# Patient Record
Sex: Female | Born: 1980 | Race: White | Hispanic: No | Marital: Married | Smoking: Never smoker
Health system: Southern US, Community
[De-identification: ages and names within clinical notes are randomized; demographics above are authoritative.]

## PROBLEM LIST (undated history)

## (undated) DIAGNOSIS — N200 Calculus of kidney: Secondary | ICD-10-CM

## (undated) DIAGNOSIS — R519 Headache, unspecified: Secondary | ICD-10-CM

## (undated) DIAGNOSIS — E559 Vitamin D deficiency, unspecified: Secondary | ICD-10-CM

## (undated) DIAGNOSIS — N939 Abnormal uterine and vaginal bleeding, unspecified: Secondary | ICD-10-CM

## (undated) DIAGNOSIS — N390 Urinary tract infection, site not specified: Secondary | ICD-10-CM

## (undated) DIAGNOSIS — D649 Anemia, unspecified: Secondary | ICD-10-CM

## (undated) DIAGNOSIS — E039 Hypothyroidism, unspecified: Secondary | ICD-10-CM

## (undated) DIAGNOSIS — E038 Other specified hypothyroidism: Secondary | ICD-10-CM

## (undated) DIAGNOSIS — R51 Headache: Secondary | ICD-10-CM

## (undated) DIAGNOSIS — E079 Disorder of thyroid, unspecified: Secondary | ICD-10-CM

## (undated) HISTORY — DX: Disorder of thyroid, unspecified: E07.9

## (undated) HISTORY — DX: Urinary tract infection, site not specified: N39.0

## (undated) HISTORY — DX: Other specified hypothyroidism: E03.8

## (undated) HISTORY — DX: Headache: R51

## (undated) HISTORY — DX: Anemia, unspecified: D64.9

## (undated) HISTORY — DX: Abnormal uterine and vaginal bleeding, unspecified: N93.9

## (undated) HISTORY — DX: Hypothyroidism, unspecified: E03.9

## (undated) HISTORY — DX: Headache, unspecified: R51.9

## (undated) HISTORY — DX: Calculus of kidney: N20.0

## (undated) HISTORY — PX: ABDOMINAL HYSTERECTOMY: SHX81

## (undated) HISTORY — DX: Vitamin D deficiency, unspecified: E55.9

---

## 2002-03-30 HISTORY — PX: FACIAL RECONSTRUCTION SURGERY: SHX631

## 2003-05-08 DIAGNOSIS — L905 Scar conditions and fibrosis of skin: Secondary | ICD-10-CM | POA: Insufficient documentation

## 2003-05-08 DIAGNOSIS — M950 Acquired deformity of nose: Secondary | ICD-10-CM | POA: Insufficient documentation

## 2003-05-08 DIAGNOSIS — J399 Disease of upper respiratory tract, unspecified: Secondary | ICD-10-CM | POA: Insufficient documentation

## 2006-12-06 ENCOUNTER — Encounter: Payer: Self-pay | Admitting: Maternal & Fetal Medicine

## 2007-03-31 ENCOUNTER — Observation Stay: Payer: Self-pay | Admitting: Obstetrics & Gynecology

## 2007-04-15 ENCOUNTER — Observation Stay: Payer: Self-pay

## 2007-05-31 ENCOUNTER — Observation Stay: Payer: Self-pay

## 2007-06-02 ENCOUNTER — Observation Stay: Payer: Self-pay | Admitting: Obstetrics & Gynecology

## 2007-06-03 ENCOUNTER — Observation Stay: Payer: Self-pay | Admitting: Emergency Medicine

## 2007-06-12 ENCOUNTER — Inpatient Hospital Stay: Payer: Self-pay

## 2007-06-20 ENCOUNTER — Observation Stay: Payer: Self-pay

## 2007-06-24 ENCOUNTER — Observation Stay: Payer: Self-pay | Admitting: Obstetrics & Gynecology

## 2007-06-27 ENCOUNTER — Observation Stay: Payer: Self-pay | Admitting: Unknown Physician Specialty

## 2007-06-28 ENCOUNTER — Observation Stay: Payer: Self-pay

## 2007-07-02 ENCOUNTER — Inpatient Hospital Stay: Payer: Self-pay | Admitting: Unknown Physician Specialty

## 2008-01-24 ENCOUNTER — Ambulatory Visit: Payer: Self-pay | Admitting: Obstetrics and Gynecology

## 2008-09-27 ENCOUNTER — Encounter: Payer: Self-pay | Admitting: Obstetrics and Gynecology

## 2008-12-04 ENCOUNTER — Encounter: Payer: Self-pay | Admitting: Pediatric Cardiology

## 2008-12-10 ENCOUNTER — Inpatient Hospital Stay: Payer: Self-pay | Admitting: Obstetrics and Gynecology

## 2009-01-03 ENCOUNTER — Observation Stay: Payer: Self-pay | Admitting: Unknown Physician Specialty

## 2009-01-16 ENCOUNTER — Observation Stay: Payer: Self-pay

## 2009-01-22 ENCOUNTER — Ambulatory Visit: Payer: Self-pay | Admitting: Urology

## 2009-01-24 ENCOUNTER — Observation Stay: Payer: Self-pay

## 2009-01-25 ENCOUNTER — Observation Stay: Payer: Self-pay | Admitting: Obstetrics & Gynecology

## 2009-01-25 ENCOUNTER — Ambulatory Visit: Payer: Self-pay | Admitting: Obstetrics & Gynecology

## 2009-01-28 ENCOUNTER — Observation Stay: Payer: Self-pay

## 2009-01-30 ENCOUNTER — Observation Stay: Payer: Self-pay | Admitting: Obstetrics & Gynecology

## 2009-02-02 ENCOUNTER — Observation Stay: Payer: Self-pay

## 2009-02-21 ENCOUNTER — Observation Stay: Payer: Self-pay

## 2009-03-08 ENCOUNTER — Observation Stay: Payer: Self-pay | Admitting: Obstetrics and Gynecology

## 2009-03-12 ENCOUNTER — Observation Stay: Payer: Self-pay | Admitting: Unknown Physician Specialty

## 2009-03-18 ENCOUNTER — Encounter: Payer: Self-pay | Admitting: Obstetrics and Gynecology

## 2009-03-18 ENCOUNTER — Observation Stay: Payer: Self-pay

## 2009-03-25 ENCOUNTER — Observation Stay: Payer: Self-pay

## 2009-03-27 ENCOUNTER — Observation Stay: Payer: Self-pay

## 2009-04-02 ENCOUNTER — Inpatient Hospital Stay: Payer: Self-pay

## 2009-06-06 ENCOUNTER — Ambulatory Visit: Payer: Self-pay | Admitting: Urology

## 2009-07-11 ENCOUNTER — Ambulatory Visit: Payer: Self-pay | Admitting: Family Medicine

## 2009-11-30 HISTORY — PX: LAPAROSCOPIC HYSTERECTOMY: SHX1926

## 2009-12-05 ENCOUNTER — Ambulatory Visit: Payer: Self-pay | Admitting: Family Medicine

## 2010-10-27 ENCOUNTER — Ambulatory Visit: Payer: Self-pay

## 2010-10-30 ENCOUNTER — Ambulatory Visit: Payer: Self-pay

## 2011-09-17 DIAGNOSIS — G43909 Migraine, unspecified, not intractable, without status migrainosus: Secondary | ICD-10-CM | POA: Insufficient documentation

## 2011-10-28 DIAGNOSIS — F329 Major depressive disorder, single episode, unspecified: Secondary | ICD-10-CM | POA: Insufficient documentation

## 2011-10-28 DIAGNOSIS — F330 Major depressive disorder, recurrent, mild: Secondary | ICD-10-CM | POA: Insufficient documentation

## 2012-09-19 DIAGNOSIS — Z90710 Acquired absence of both cervix and uterus: Secondary | ICD-10-CM | POA: Insufficient documentation

## 2013-08-17 LAB — TSH: TSH: 0.87 u[IU]/mL (ref 0.41–5.90)

## 2013-08-17 LAB — BASIC METABOLIC PANEL
BUN: 12 mg/dL (ref 4–21)
Creatinine: 0.6 mg/dL (ref 0.5–1.1)
GLUCOSE: 78 mg/dL
POTASSIUM: 4 mmol/L (ref 3.4–5.3)
SODIUM: 140 mmol/L (ref 137–147)

## 2013-08-17 LAB — CBC AND DIFFERENTIAL
HCT: 39 % (ref 36–46)
HCT: 39 % (ref 36–46)
HEMOGLOBIN: 13.2 g/dL (ref 12.0–16.0)
HEMOGLOBIN: 13.5 g/dL (ref 12.0–16.0)
Neutrophils Absolute: 4 /uL
PLATELETS: 197 10*3/uL (ref 150–399)
WBC: 6.2 10*3/mL
WBC: 6.2 10*3/mL

## 2014-03-29 ENCOUNTER — Emergency Department: Payer: Self-pay | Admitting: Emergency Medicine

## 2014-03-29 LAB — URINALYSIS, COMPLETE
BACTERIA: NONE SEEN
BLOOD: NEGATIVE
Bilirubin,UR: NEGATIVE
GLUCOSE, UR: NEGATIVE mg/dL (ref 0–75)
Ketone: NEGATIVE
LEUKOCYTE ESTERASE: NEGATIVE
Nitrite: NEGATIVE
PH: 7 (ref 4.5–8.0)
Protein: NEGATIVE
RBC,UR: NONE SEEN /HPF (ref 0–5)
SPECIFIC GRAVITY: 1.009 (ref 1.003–1.030)
Squamous Epithelial: 1
WBC UR: <1 /HPF (ref 0–5)

## 2014-03-29 LAB — COMPREHENSIVE METABOLIC PANEL
ALBUMIN: 3.9 g/dL (ref 3.4–5.0)
ALK PHOS: 126 U/L — AB
ALT: 23 U/L (ref 12–78)
Anion Gap: 6 — ABNORMAL LOW (ref 7–16)
BUN: 14 mg/dL (ref 7–18)
Bilirubin,Total: 0.4 mg/dL (ref 0.2–1.0)
CO2: 30 mmol/L (ref 21–32)
Calcium, Total: 9.2 mg/dL (ref 8.5–10.1)
Chloride: 104 mmol/L (ref 98–107)
Creatinine: 0.54 mg/dL — ABNORMAL LOW (ref 0.60–1.30)
EGFR (African American): >60
EGFR (Non-African Amer.): >60
Glucose: 84 mg/dL (ref 65–99)
Osmolality: 279 (ref 275–301)
Potassium: 3.6 mmol/L (ref 3.5–5.1)
SGOT(AST): 14 U/L — ABNORMAL LOW (ref 15–37)
Sodium: 140 mmol/L (ref 136–145)
Total Protein: 7.7 g/dL (ref 6.4–8.2)

## 2014-03-29 LAB — CBC
HCT: 43.2 % (ref 35.0–47.0)
HGB: 14.1 g/dL (ref 12.0–16.0)
MCH: 27.9 pg (ref 26.0–34.0)
MCHC: 32.6 g/dL (ref 32.0–36.0)
MCV: 85 fL (ref 80–100)
Platelet: 216 10*3/uL (ref 150–440)
RBC: 5.05 10*6/uL (ref 3.80–5.20)
RDW: 13.3 % (ref 11.5–14.5)
WBC: 6.4 10*3/uL (ref 3.6–11.0)

## 2014-03-29 LAB — LIPASE, BLOOD: Lipase: 225 U/L (ref 73–393)

## 2014-04-02 LAB — HEPATIC FUNCTION PANEL
ALK PHOS: 102 U/L (ref 25–125)
ALT: 27 U/L (ref 7–35)
AST: 21 U/L (ref 13–35)

## 2014-04-02 LAB — BASIC METABOLIC PANEL
BUN: 15 mg/dL (ref 4–21)
CREATININE: 0.8 mg/dL (ref 0.5–1.1)
GLUCOSE: 91 mg/dL
POTASSIUM: 3.8 mmol/L (ref 3.4–5.3)
SODIUM: 140 mmol/L (ref 137–147)

## 2014-04-02 LAB — TSH: TSH: 3.12 u[IU]/mL (ref 0.41–5.90)

## 2014-07-04 ENCOUNTER — Encounter: Payer: Self-pay | Admitting: *Deleted

## 2014-07-19 ENCOUNTER — Ambulatory Visit: Payer: Self-pay | Admitting: General Surgery

## 2014-07-31 ENCOUNTER — Ambulatory Visit (INDEPENDENT_AMBULATORY_CARE_PROVIDER_SITE_OTHER): Payer: BC Managed Care – PPO | Admitting: General Surgery

## 2014-07-31 ENCOUNTER — Encounter: Payer: Self-pay | Admitting: General Surgery

## 2014-07-31 VITALS — BP 114/72 | HR 76 | Resp 12 | Ht 62.0 in | Wt 189.0 lb

## 2014-07-31 DIAGNOSIS — K644 Residual hemorrhoidal skin tags: Secondary | ICD-10-CM

## 2014-07-31 NOTE — Progress Notes (Signed)
Patient ID: Bethany Mitchell, female   DOB: [birth year], 33 y.o.   MRN: [id number]  Chief Complaint  Patient presents with  . Mass    near rectum    HPI Bethany Mitchell is a 33 y.o. female.  Here today to evaluate a small lesion near her rectum. States it has been there for about 5 years. Does not feel it has changed in size. Seems to be like a "skin tag". For the past 6 months it seems to be bother her more especially with wiping after going to the restroom. It is not draining. It seems to irritating her more. Daily bowels movements are normal.  The patient is accompanied today by her husband, [name].  HPI  Past Medical History  Diagnosis Date  . Thyroid disease   . Anemia     blood transfusion 2003    Past Surgical History  Procedure Laterality Date  . Laparoscopic hysterectomy  2011  . Facial reconstruction surgery  May 2003    Family History  Problem Relation Age of Onset  . Diabetes Father   . Thyroid disease Mother     Social History History  Substance Use Topics  . Smoking status: Never Smoker   . Smokeless tobacco: Never Used  . Alcohol Use: Yes     Comment: occasionally    No Known Allergies  Current Outpatient Prescriptions  Medication Sig Dispense Refill  . levothyroxine (SYNTHROID, LEVOTHROID) 50 MCG tablet Take 50 mcg by mouth daily before breakfast.       No current facility-administered medications for this visit.    Review of Systems Review of Systems  Constitutional: Negative.   Respiratory: Negative.   Cardiovascular: Negative.     Blood pressure 114/72, pulse 76, resp. rate 12, height 5\' 2"  (1.575 m), weight 189 lb (85.73 kg).  Physical Exam Physical Exam  Constitutional: She is oriented to person, place, and time. She appears well-developed and well-nourished.  Neck: Neck supple.  Cardiovascular: Normal rate, regular rhythm and normal heart sounds.   Pulmonary/Chest: Effort normal and breath sounds normal.  Abdominal: Soft. Normal  appearance.  Genitourinary:     Tiny anal skin tag  Lymphadenopathy:    She has no cervical adenopathy.  Neurological: She is alert and oriented to person, place, and time.  Skin: Skin is warm and dry.       Assessment    Perianal inflammation secondary to vigorous perineural cleansing. Small skin tag.     Plan    The patient has been encouraged to discontinue the use of soap on the perineum.  If she does not show adequate improvement over the next month she was encouraged to call for reassessment.  No soap to rectal area, use wet wipes. Use preparation H after BM and at bedtime.    PCP [facility]  Primary Care Ref. Dr. Wynona Luna, [place] 08/01/2014, 8:28 PM

## 2014-07-31 NOTE — Patient Instructions (Addendum)
The patient is aware to call back for any questions or concerns. No soap to rectal area, use wet wipes. Use preparation H after BM and at bedtime.

## 2014-08-01 DIAGNOSIS — K644 Residual hemorrhoidal skin tags: Secondary | ICD-10-CM | POA: Insufficient documentation

## 2014-10-01 ENCOUNTER — Encounter: Payer: Self-pay | Admitting: General Surgery

## 2014-11-05 LAB — TSH: TSH: 6.16 u[IU]/mL — AB (ref 0.41–5.90)

## 2015-02-18 ENCOUNTER — Encounter: Payer: Self-pay | Admitting: Nurse Practitioner

## 2015-02-18 ENCOUNTER — Encounter (INDEPENDENT_AMBULATORY_CARE_PROVIDER_SITE_OTHER): Payer: Self-pay

## 2015-02-18 ENCOUNTER — Ambulatory Visit (INDEPENDENT_AMBULATORY_CARE_PROVIDER_SITE_OTHER): Payer: BC Managed Care – PPO | Admitting: Nurse Practitioner

## 2015-02-18 VITALS — BP 110/78 | HR 61 | Temp 98.5°F | Resp 12 | Ht 61.75 in | Wt 198.8 lb

## 2015-02-18 DIAGNOSIS — E038 Other specified hypothyroidism: Secondary | ICD-10-CM | POA: Insufficient documentation

## 2015-02-18 DIAGNOSIS — Z862 Personal history of diseases of the blood and blood-forming organs and certain disorders involving the immune mechanism: Secondary | ICD-10-CM

## 2015-02-18 DIAGNOSIS — Z7189 Other specified counseling: Secondary | ICD-10-CM

## 2015-02-18 DIAGNOSIS — E039 Hypothyroidism, unspecified: Secondary | ICD-10-CM

## 2015-02-18 DIAGNOSIS — Z7689 Persons encountering health services in other specified circumstances: Secondary | ICD-10-CM | POA: Insufficient documentation

## 2015-02-18 MED ORDER — PHENTERMINE HCL 37.5 MG PO TABS: 37.5000 mg | ORAL_TABLET | Freq: Every day | ORAL | Status: DC

## 2015-02-18 NOTE — Progress Notes (Signed)
Subjective:    Patient ID: Bethany Mitchell, female    DOB: [birth year], 34 y.o.   MRN: [id number]  HPI  Bethany Mitchell is a 34 yo female establishing care today.   1) New Pt. Info-    Diet- Eats out and at home   Exercise- chases around young children   Immunizations- UTD  Pap- Nov. 2015 at [facility]- Not UTD  2) Chronic Problems-  Hypothyroidism- No recent thyroid levels.   Kidney Stones- with her last pregnancy   Anemia- in past  Frequent headaches- Improved   3) Acute Problems-  Weight gain quickly up 9 month period  Herbalife- gained after stopping   Weight Watchers- modified version   Carb cutting programs- not helpful   Review of Systems  Constitutional: Negative for fever, chills, diaphoresis and fatigue.  Eyes: Negative for visual disturbance.  Respiratory: Negative for chest tightness, shortness of breath and wheezing.   Cardiovascular: Negative for chest pain, palpitations and leg swelling.  Gastrointestinal: Negative for nausea, vomiting, diarrhea and constipation.  Genitourinary: Negative for dysuria.  Musculoskeletal: Negative for back pain and neck pain.  Skin: Negative for rash.  Neurological: Negative for dizziness, weakness, numbness and headaches.  Hematological: Bruises/bleeds easily.       Bruises easily- chronic  Psychiatric/Behavioral: Negative for suicidal ideas and sleep disturbance. The patient is not nervous/anxious.    Past Medical History  Diagnosis Date  . Thyroid disease   . Anemia     blood transfusion 2003  . Frequent headaches   . Kidney stone     History   Social History  . Marital Status: Married    Spouse Name: N/A  . Number of Children: N/A  . Years of Education: N/A   Occupational History  . Not on file.   Social History Main Topics  . Smoking status: Never Smoker   . Smokeless tobacco: Never Used  . Alcohol Use: 0.0 oz/week    0 Standard drinks or equivalent per week     Comment:  occasionally  . Drug Use: No  . Sexual Activity: Not Currently   Other Topics Concern  . Not on file   Social History Narrative   Works for school system   Lives at home with husband and 2 girls   Pets 2 dogs and 2 fish    Left handed   Some college    Enjoys being with the family     Past Surgical History  Procedure Laterality Date  . Laparoscopic hysterectomy  2011  . Facial reconstruction surgery  May 2003  . Abdominal hysterectomy      Partial    Family History  Problem Relation Age of Onset  . Diabetes Father   . Thyroid disease Mother   . Depression Mother   . Anxiety disorder Mother   . Cancer Maternal Grandmother     breast cancer  . Thyroid disease Maternal Grandmother   . Diabetes Maternal Grandfather     No Known Allergies  Current Outpatient Prescriptions on File Prior to Visit  Medication Sig Dispense Refill  . levothyroxine (SYNTHROID, LEVOTHROID) 50 MCG tablet Take 50 mcg by mouth daily before breakfast.     No current facility-administered medications on file prior to visit.      Objective:   Physical Exam  Constitutional: She is oriented to person, place, and time. She appears well-developed and well-nourished. No distress.  BP 110/78 mmHg  Pulse 61  Temp(Src) 98.5 F (36.9 C) (Oral)  Resp 12  Ht 5' 1.75" (1.568 m)  Wt 198 lb 12 oz (90.152 kg)  BMI 36.67 kg/m2  SpO2 97%   HENT:  Head: Normocephalic and atraumatic.  Right Ear: External ear normal.  Left Ear: External ear normal.  Cardiovascular: Normal rate, regular rhythm and normal heart sounds.  Exam reveals no gallop and no friction rub.   No murmur heard. Pulmonary/Chest: Effort normal and breath sounds normal. No respiratory distress. She has no wheezes. She has no rales. She exhibits no tenderness.  Neurological: She is alert and oriented to person, place, and time. No cranial nerve deficit. She exhibits normal muscle tone. Coordination normal.  Skin: Skin is warm and dry. No  rash noted. She is not diaphoretic.  Psychiatric: She has a normal mood and affect. Her behavior is normal. Judgment and thought content normal.      Assessment & Plan:

## 2015-02-18 NOTE — Assessment & Plan Note (Signed)
Checking TSH and T4 before refilling the 50 mcg tablet script.

## 2015-02-18 NOTE — Progress Notes (Signed)
Pre visit review using our clinic review tool, if applicable. No additional management support is needed unless otherwise documented below in the visit note. 

## 2015-02-18 NOTE — Assessment & Plan Note (Signed)
Discussed acute and chronic issues. Reviewed health maintenance measures, PFSHx, and immunizations. Obtain routine labs TSH, Lipid panel, CBC w/ diff, A1c, and CMET.

## 2015-02-18 NOTE — Patient Instructions (Addendum)
I have written a prescription for phentermine for 1 month. You can pick it up at the office during business hours.  Please have your vital signs checked a week after starting and let me know the results.  I will need those measurements, or you can return for an RN visit, before I can refill the phentermine for additional months.  If pulse is > 100 or BP is > 140/80 we should repeat your evaluation after being off  of phentermine for a few days.  If both are below those parameters,  You can continue the medication and return to see me in 3 months.  Most people take 1/2 tablet in the morning,  The second half by 2 PM to avoid insomnia.  If you have not lost 9.9 lbs (which is 5% of your starting weight)  by the end of the  3 months, the risks of continuing the medication outweigh the benefits and  we will have to discontinue it and find a Plan B .   Welcome to [facility]!

## 2015-02-19 LAB — CBC WITH DIFFERENTIAL/PLATELET
Basophils Absolute: 0 10*3/uL (ref 0.0–0.1)
Basophils Relative: 0.5 % (ref 0.0–3.0)
Eosinophils Absolute: 0.1 10*3/uL (ref 0.0–0.7)
Eosinophils Relative: 0.8 % (ref 0.0–5.0)
HEMATOCRIT: 39.2 % (ref 36.0–46.0)
Hemoglobin: 13.5 g/dL (ref 12.0–15.0)
Lymphocytes Relative: 29.6 % (ref 12.0–46.0)
Lymphs Abs: 2.2 10*3/uL (ref 0.7–4.0)
MCHC: 34.5 g/dL (ref 30.0–36.0)
MCV: 81.4 fl (ref 78.0–100.0)
MONOS PCT: 5.3 % (ref 3.0–12.0)
Monocytes Absolute: 0.4 10*3/uL (ref 0.1–1.0)
NEUTROS ABS: 4.8 10*3/uL (ref 1.4–7.7)
Neutrophils Relative %: 63.8 % (ref 43.0–77.0)
Platelets: 231 10*3/uL (ref 150.0–400.0)
RBC: 4.82 Mil/uL (ref 3.87–5.11)
RDW: 13.2 % (ref 11.5–15.5)
WBC: 7.6 10*3/uL (ref 4.0–10.5)

## 2015-02-19 LAB — TSH: TSH: 6.08 u[IU]/mL — AB (ref 0.35–4.50)

## 2015-02-19 LAB — T4, FREE: Free T4: 0.7 ng/dL (ref 0.60–1.60)

## 2015-02-21 ENCOUNTER — Encounter: Payer: Self-pay | Admitting: *Deleted

## 2015-02-25 ENCOUNTER — Telehealth: Payer: Self-pay | Admitting: Nurse Practitioner

## 2015-02-25 MED ORDER — PHENTERMINE HCL 37.5 MG PO TABS: 37.5000 mg | ORAL_TABLET | Freq: Every day | ORAL | Status: DC

## 2015-02-25 NOTE — Telephone Encounter (Signed)
Okay to refill #30 and 1 refill.

## 2015-02-25 NOTE — Telephone Encounter (Signed)
Please advise if okay to refill phentermine Rx. Review BP below Thank you

## 2015-02-25 NOTE — Telephone Encounter (Signed)
Rx faxed to [facility] pharmacy in [place] as requested by patient.

## 2015-02-25 NOTE — Telephone Encounter (Signed)
The patient is requesting a refill on her phentermine . She was asked to call back to inform Morey Hummingbird of her BP reading that reading is 110/74.

## 2015-02-25 NOTE — Telephone Encounter (Signed)
Rx printed and given to carrie for signature.

## 2015-03-14 ENCOUNTER — Encounter: Payer: Self-pay | Admitting: Nurse Practitioner

## 2015-09-09 ENCOUNTER — Ambulatory Visit (INDEPENDENT_AMBULATORY_CARE_PROVIDER_SITE_OTHER): Payer: BC Managed Care – PPO | Admitting: Nurse Practitioner

## 2015-09-09 VITALS — BP 110/80 | HR 60 | Temp 98.5°F | Resp 14 | Ht 62.0 in | Wt 184.6 lb

## 2015-09-09 DIAGNOSIS — E039 Hypothyroidism, unspecified: Secondary | ICD-10-CM

## 2015-09-09 DIAGNOSIS — E669 Obesity, unspecified: Secondary | ICD-10-CM

## 2015-09-09 DIAGNOSIS — J3489 Other specified disorders of nose and nasal sinuses: Secondary | ICD-10-CM

## 2015-09-09 MED ORDER — FLUTICASONE PROPIONATE 50 MCG/ACT NA SUSP
2.0000 | Freq: Every day | NASAL | Status: DC
Start: 2015-09-09 — End: 2016-03-26

## 2015-09-09 NOTE — Progress Notes (Signed)
Patient ID: Bethany Mitchell, female    DOB: 04/10/1981  Age: 34 y.o. MRN: [id number]  CC: Follow-up   HPI Bethany Mitchell presents for follow up of weight loss and thyroid.  1) Even with taking a half a tablet still not sleeping weel.  Diet- Small portions, down to 1 soda every 2 days, daily 1 cup of coffee  Exercise- Swimming, walking, and uses resistance bands Phentermine- been on it since March and down 14 lbs   Took a break occasionally, last dosage 3 weeks  Goal 155 lbs   Wt Readings from Last 3 Encounters:  09/09/15 184 lb 9.6 oz (83.734 kg)  02/18/15 198 lb 12 oz (90.152 kg)  07/31/14 189 lb (85.73 kg)   2) Sinus pressure  Sudafed sinus/headache  Zyrtec   History Bethany Mitchell has a past medical history of Thyroid disease; Anemia; Frequent headaches; and Kidney stone.   She has past surgical history that includes Laparoscopic hysterectomy (2011); Facial reconstruction surgery (May 2003); and Abdominal hysterectomy.   Her family history includes Anxiety disorder in her mother; Cancer in her maternal grandmother; Depression in her mother; Diabetes in her father and maternal grandfather; Thyroid disease in her maternal grandmother and mother.She reports that she has never smoked. She has never used smokeless tobacco. She reports that she drinks alcohol. She reports that she does not use illicit drugs.  Outpatient Prescriptions Prior to Visit  Medication Sig Dispense Refill  . cetirizine (ZYRTEC) 10 MG tablet Take 10 mg by mouth daily.    Marland Kitchen levothyroxine (SYNTHROID, LEVOTHROID) 50 MCG tablet Take 50 mcg by mouth daily before breakfast.    . phentermine (ADIPEX-P) 37.5 MG tablet Take 1 tablet (37.5 mg total) by mouth daily before breakfast. 30 tablet 1   No facility-administered medications prior to visit.   ROS Review of Systems  Constitutional: Negative for fever, chills, diaphoresis and fatigue.  HENT: Positive for sinus pressure.   Respiratory: Negative for chest tightness,  shortness of breath and wheezing.   Cardiovascular: Negative for chest pain, palpitations and leg swelling.  Gastrointestinal: Negative for nausea, vomiting and diarrhea.  Skin: Negative for rash.  Neurological: Negative for dizziness, weakness, numbness and headaches.  Psychiatric/Behavioral: Positive for sleep disturbance. The patient is not nervous/anxious.    Objective:  BP 110/80 mmHg  Pulse 60  Temp(Src) 98.5 F (36.9 C)  Resp 14  Ht 5\' 2"  (1.575 m)  Wt 184 lb 9.6 oz (83.734 kg)  BMI 33.76 kg/m2  SpO2 99%  Physical Exam  Constitutional: She is oriented to person, place, and time. She appears well-developed and well-nourished. No distress.  HENT:  Head: Normocephalic and atraumatic.  Right Ear: External ear normal.  Left Ear: External ear normal.  TMs and canals clear bilaterally  Eyes: EOM are normal. Pupils are equal, round, and reactive to light. Right eye exhibits no discharge. Left eye exhibits no discharge. No scleral icterus.  Neck: Normal range of motion. Neck supple. No thyromegaly present.  Cardiovascular: Normal rate, regular rhythm and normal heart sounds.   Pulmonary/Chest: Effort normal and breath sounds normal. No respiratory distress. She has no wheezes. She has no rales. She exhibits no tenderness.  Lymphadenopathy:    She has no cervical adenopathy.  Neurological: She is alert and oriented to person, place, and time. No cranial nerve deficit. She exhibits normal muscle tone. Coordination normal.  Skin: Skin is warm and dry. No rash noted. She is not diaphoretic.  Psychiatric: She has a normal mood and affect.  Her behavior is normal. Judgment and thought content normal.   Assessment & Plan:   Bethany Mitchell was seen today for follow-up.  Diagnoses and all orders for this visit:  Hypothyroidism, unspecified hypothyroidism type -     TSH -     T4, free  Other orders -     fluticasone (FLONASE) 50 MCG/ACT nasal spray; Place 2 sprays into both nostrils  daily.   I have discontinued Bethany Mitchell's phentermine. I am also having her start on fluticasone. Additionally, I am having her maintain her levothyroxine and cetirizine.  Meds ordered this encounter  Medications  . fluticasone (FLONASE) 50 MCG/ACT nasal spray    Sig: Place 2 sprays into both nostrils daily.    Dispense:  16 g    Refill:  6    Order Specific Question:  Supervising Provider    Answer:  Crecencio Mc [[id number]]     Follow-up: No Follow-up on file.

## 2015-09-09 NOTE — Patient Instructions (Signed)
Please visit the lab before leaving today.   Flonase nasal spray for sinus pressure.

## 2015-09-09 NOTE — Progress Notes (Signed)
Pre visit review using our clinic review tool, if applicable. No additional management support is needed unless otherwise documented below in the visit note. 

## 2015-09-10 LAB — TSH: TSH: 4.72 u[IU]/mL — ABNORMAL HIGH (ref 0.35–4.50)

## 2015-09-10 LAB — T4, FREE: Free T4: 0.73 ng/dL (ref 0.60–1.60)

## 2015-09-11 ENCOUNTER — Encounter: Payer: Self-pay | Admitting: Nurse Practitioner

## 2015-09-11 DIAGNOSIS — E669 Obesity, unspecified: Secondary | ICD-10-CM | POA: Insufficient documentation

## 2015-09-11 DIAGNOSIS — E66812 Obesity, class 2: Secondary | ICD-10-CM | POA: Insufficient documentation

## 2015-09-11 DIAGNOSIS — J3489 Other specified disorders of nose and nasal sinuses: Secondary | ICD-10-CM | POA: Insufficient documentation

## 2015-09-11 NOTE — Assessment & Plan Note (Signed)
We'll try Flonase for sinus pressure. Gave patient information regarding symptoms of sinus infection and when to return to care for antibiotics.

## 2015-09-11 NOTE — Assessment & Plan Note (Signed)
Patient is doing well on half a tablet of Adipex-P. She would like to stop it at this time and feel she can keep going with diet and exercise.  Wt Readings from Last 3 Encounters:  09/09/15 184 lb 9.6 oz (83.734 kg)  02/18/15 198 lb 12 oz (90.152 kg)  07/31/14 189 lb (85.73 kg)

## 2015-09-11 NOTE — Assessment & Plan Note (Signed)
Recheck thyroid panel today 

## 2016-03-26 ENCOUNTER — Encounter: Payer: Self-pay | Admitting: Nurse Practitioner

## 2016-03-26 ENCOUNTER — Ambulatory Visit (INDEPENDENT_AMBULATORY_CARE_PROVIDER_SITE_OTHER): Payer: BC Managed Care – PPO | Admitting: Nurse Practitioner

## 2016-03-26 VITALS — BP 104/72 | HR 68 | Temp 98.2°F | Resp 12 | Ht 62.0 in | Wt 198.0 lb

## 2016-03-26 DIAGNOSIS — E669 Obesity, unspecified: Secondary | ICD-10-CM

## 2016-03-26 DIAGNOSIS — I889 Nonspecific lymphadenitis, unspecified: Secondary | ICD-10-CM | POA: Diagnosis not present

## 2016-03-26 MED ORDER — DOXYCYCLINE HYCLATE 100 MG PO TABS: 100.0000 mg | ORAL_TABLET | Freq: Two times a day (BID) | ORAL | Status: DC

## 2016-03-26 MED ORDER — PHENTERMINE HCL 37.5 MG PO TABS: 37.5000 mg | ORAL_TABLET | Freq: Every day | ORAL | Status: DC

## 2016-03-26 NOTE — Progress Notes (Signed)
Patient ID: Bethany Mitchell, female    DOB: 1981-09-28  Age: 35 y.o. MRN: [id number]  CC: Mass and Weight Loss   HPI Bethany Mitchell presents for right axilla concern and wt loss medication.   1) Pt reports deep tenderness in right axilla at 12-1 o'clock position- pt has to show me because there is no visual differences. She reports feeling tenderness while shaving and putting on Deoderant then she felt this knot deep in the axilla. She denies growing, swelling, drainage, other signs of infection nor breast concerns of right breast.   2) Pt interested in re-starting phentermine due to Wt gain. She now understands that she has to continue the healthy diet and exercise along with the medication AND once off of the medication to maintain. Denies having jitteriness, insomnia, anxiety, nor palpitations. She reports she would like to do the half tablet again  History Bethany Mitchell has a past medical history of Thyroid disease; Anemia; Frequent headaches; and Kidney stone.   She has past surgical history that includes Laparoscopic hysterectomy (2011); Facial reconstruction surgery (May 2003); and Abdominal hysterectomy.   Her family history includes Anxiety disorder in her mother; Cancer in her maternal grandmother; Depression in her mother; Diabetes in her father and maternal grandfather; Thyroid disease in her maternal grandmother and mother.She reports that she has never smoked. She has never used smokeless tobacco. She reports that she drinks alcohol. She reports that she does not use illicit drugs.  Outpatient Prescriptions Prior to Visit  Medication Sig Dispense Refill  . cetirizine (ZYRTEC) 10 MG tablet Take 10 mg by mouth daily. Reported on 03/26/2016    . levothyroxine (SYNTHROID, LEVOTHROID) 50 MCG tablet Take 50 mcg by mouth daily before breakfast. Reported on 03/26/2016    . fluticasone (FLONASE) 50 MCG/ACT nasal spray Place 2 sprays into both nostrils daily. (Patient not taking: Reported on  03/26/2016) 16 g 6   No facility-administered medications prior to visit.    ROS Review of Systems  Constitutional: Negative for fever, chills, diaphoresis and fatigue.  Respiratory: Negative for chest tightness, shortness of breath and wheezing.   Cardiovascular: Negative for chest pain, palpitations and leg swelling.  Gastrointestinal: Negative for nausea, vomiting and diarrhea.  Skin: Negative for color change, pallor, rash and wound.  Neurological: Negative for dizziness, weakness, numbness and headaches.  Psychiatric/Behavioral: The patient is not nervous/anxious.     Objective:  BP 104/72 mmHg  Pulse 68  Temp(Src) 98.2 F (36.8 C) (Oral)  Resp 12  Ht 5\' 2"  (1.575 m)  Wt 198 lb (89.812 kg)  BMI 36.21 kg/m2  SpO2 98%  Physical Exam  Constitutional: She is oriented to person, place, and time. She appears well-developed and well-nourished. No distress.  HENT:  Head: Normocephalic and atraumatic.  Right Ear: External ear normal.  Left Ear: External ear normal.  Cardiovascular: Normal rate, regular rhythm, normal heart sounds and intact distal pulses.  Exam reveals no gallop and no friction rub.   No murmur heard. Pulmonary/Chest: Effort normal and breath sounds normal. No respiratory distress. She has no wheezes. She has no rales. She exhibits no tenderness.  Lymphadenopathy:    She has axillary adenopathy.       Right axillary: Lateral adenopathy present. No pectoral adenopathy present.       Left axillary: No pectoral and no lateral adenopathy present. No cyst or visual abnormality visualize- 1 cm or greater mass that is tender and rubbery felt to palpation, left axilla without findings  Neurological: She  is alert and oriented to person, place, and time.  Skin: Skin is warm and dry. No rash noted. She is not diaphoretic.  Psychiatric: She has a normal mood and affect. Her behavior is normal. Judgment and thought content normal.   Assessment & Plan:   Bethany Mitchell was seen  today for mass and weight loss.  Diagnoses and all orders for this visit:  Axillary lymphadenitis  Obese  Other orders -     phentermine (ADIPEX-P) 37.5 MG tablet; Take 1 tablet (37.5 mg total) by mouth daily before breakfast. -     doxycycline (VIBRA-TABS) 100 MG tablet; Take 1 tablet (100 mg total) by mouth 2 (two) times daily.  I have discontinued Bethany Mitchell's fluticasone. I am also having her start on phentermine and doxycycline. Additionally, I am having her maintain her levothyroxine and cetirizine.  Meds ordered this encounter  Medications  . phentermine (ADIPEX-P) 37.5 MG tablet    Sig: Take 1 tablet (37.5 mg total) by mouth daily before breakfast.    Dispense:  30 tablet    Refill:  2    Order Specific Question:  Supervising Provider    Answer:  Deborra Medina L [[id number]]  . doxycycline (VIBRA-TABS) 100 MG tablet    Sig: Take 1 tablet (100 mg total) by mouth 2 (two) times daily.    Dispense:  14 tablet    Refill:  0    Order Specific Question:  Supervising Provider    Answer:  Crecencio Mc [[id number]]     Follow-up: Return if symptoms worsen or fail to improve.

## 2016-03-26 NOTE — Patient Instructions (Addendum)
Try the doxycyline as prescribed.   Let me know if you don't get improvement and we will get the ultrasound.

## 2016-03-31 DIAGNOSIS — I889 Nonspecific lymphadenitis, unspecified: Secondary | ICD-10-CM | POA: Insufficient documentation

## 2016-03-31 NOTE — Assessment & Plan Note (Signed)
Pt is wishing to start back on Adipex- has been over three months off of medication Pt will take 1/2 tablet prn  3 months of medication given to pt to take to pharmacy  Follow up if feeling any jitteriness, insomnia, or increased anxiety/palpitations.

## 2016-03-31 NOTE — Assessment & Plan Note (Signed)
Deep, not superficial findings believed to be more lymphadenitis than abscess. Treating with doxycyline at fist- discussed further work ups including [place] and mammogram due to breast CA history in family. Pt wants to see if the doxy clears this up first then will let me know if not.

## 2016-04-08 ENCOUNTER — Other Ambulatory Visit: Payer: Self-pay | Admitting: Nurse Practitioner

## 2016-04-08 ENCOUNTER — Telehealth: Payer: Self-pay | Admitting: *Deleted

## 2016-04-08 NOTE — Telephone Encounter (Signed)
Would you like patient to return for evaluation?

## 2016-04-08 NOTE — Telephone Encounter (Signed)
Patient stated that she was advise to call the office in ten days if the knot under her arm did not dissolve. She finished her antibiotic on Friday, with no results,she stated that the spot was still tender and sore. Please advise if she will need a follow up appt.

## 2016-04-09 ENCOUNTER — Other Ambulatory Visit: Payer: Self-pay | Admitting: Nurse Practitioner

## 2016-04-09 DIAGNOSIS — R590 Localized enlarged lymph nodes: Secondary | ICD-10-CM

## 2016-04-09 DIAGNOSIS — I889 Nonspecific lymphadenitis, unspecified: Secondary | ICD-10-CM

## 2016-04-09 NOTE — Telephone Encounter (Signed)
I have ordered a [place] to evaluate. Please let pt know they will call to set up. Thanks!

## 2016-04-09 NOTE — Telephone Encounter (Signed)
Left her a VM with details that a [place] will be set up. thanks

## 2016-04-23 ENCOUNTER — Ambulatory Visit
Admission: RE | Admit: 2016-04-23 | Discharge: 2016-04-23 | Disposition: A | Discharge status: Home / Self Care | Payer: BC Managed Care – PPO | Source: Ambulatory Visit | Attending: Nurse Practitioner | Admitting: Nurse Practitioner

## 2016-04-23 ENCOUNTER — Other Ambulatory Visit: Payer: Self-pay | Admitting: Nurse Practitioner

## 2016-04-23 DIAGNOSIS — R590 Localized enlarged lymph nodes: Secondary | ICD-10-CM

## 2016-04-23 DIAGNOSIS — I889 Nonspecific lymphadenitis, unspecified: Secondary | ICD-10-CM

## 2016-04-23 DIAGNOSIS — N644 Mastodynia: Secondary | ICD-10-CM | POA: Insufficient documentation

## 2016-04-29 ENCOUNTER — Other Ambulatory Visit: Payer: Self-pay | Admitting: Family Medicine

## 2016-04-29 DIAGNOSIS — N63 Unspecified lump in unspecified breast: Secondary | ICD-10-CM

## 2016-05-08 ENCOUNTER — Ambulatory Visit
Admission: RE | Admit: 2016-05-08 | Discharge: 2016-05-08 | Disposition: A | Discharge status: Home / Self Care | Payer: BC Managed Care – PPO | Source: Ambulatory Visit | Attending: Family Medicine | Admitting: Family Medicine

## 2016-05-08 ENCOUNTER — Ambulatory Visit: Payer: BC Managed Care – PPO

## 2016-05-08 DIAGNOSIS — N63 Unspecified lump in unspecified breast: Secondary | ICD-10-CM

## 2016-05-08 DIAGNOSIS — D242 Benign neoplasm of left breast: Secondary | ICD-10-CM | POA: Insufficient documentation

## 2016-05-11 LAB — SURGICAL PATHOLOGY

## 2016-10-06 ENCOUNTER — Emergency Department
Admission: EM | Admit: 2016-10-06 | Discharge: 2016-10-06 | Disposition: A | Discharge status: Home / Self Care | Payer: BC Managed Care – PPO | Attending: Emergency Medicine | Admitting: Emergency Medicine

## 2016-10-06 ENCOUNTER — Encounter: Payer: Self-pay | Admitting: Emergency Medicine

## 2016-10-06 DIAGNOSIS — Z79899 Other long term (current) drug therapy: Secondary | ICD-10-CM | POA: Insufficient documentation

## 2016-10-06 DIAGNOSIS — J209 Acute bronchitis, unspecified: Secondary | ICD-10-CM | POA: Diagnosis not present

## 2016-10-06 DIAGNOSIS — Z792 Long term (current) use of antibiotics: Secondary | ICD-10-CM | POA: Insufficient documentation

## 2016-10-06 DIAGNOSIS — R05 Cough: Secondary | ICD-10-CM | POA: Diagnosis present

## 2016-10-06 DIAGNOSIS — J069 Acute upper respiratory infection, unspecified: Secondary | ICD-10-CM

## 2016-10-06 MED ORDER — MAGIC MOUTHWASH W/LIDOCAINE: 5.0000 mL | Freq: Four times a day (QID) | ORAL | 0 refills | Status: DC

## 2016-10-06 MED ORDER — METHYLPREDNISOLONE SODIUM SUCC 125 MG IJ SOLR
125.0000 mg | Freq: Once | INTRAMUSCULAR | Status: AC
  Administered 2016-10-06: 125 mg via INTRAMUSCULAR
  Filled 2016-10-06: qty 2

## 2016-10-06 MED ORDER — BENZONATATE 200 MG PO CAPS: 200.0000 mg | ORAL_CAPSULE | Freq: Four times a day (QID) | ORAL | 0 refills | Status: DC | PRN

## 2016-10-06 MED ORDER — FLUTICASONE PROPIONATE 50 MCG/ACT NA SUSP: 1.0000 | Freq: Two times a day (BID) | NASAL | 0 refills | Status: DC

## 2016-10-06 MED ORDER — AZITHROMYCIN 250 MG PO TABS: ORAL_TABLET | ORAL | 0 refills | Status: DC

## 2016-10-06 MED ORDER — ALBUTEROL SULFATE (2.5 MG/3ML) 0.083% IN NEBU
2.5000 mg | INHALATION_SOLUTION | Freq: Once | RESPIRATORY_TRACT | Status: AC
  Administered 2016-10-06: 2.5 mg via RESPIRATORY_TRACT
  Filled 2016-10-06: qty 3

## 2016-10-06 MED ORDER — ALBUTEROL SULFATE HFA 108 (90 BASE) MCG/ACT IN AERS: 2.0000 | INHALATION_SPRAY | RESPIRATORY_TRACT | 0 refills | Status: DC | PRN

## 2016-10-06 MED ORDER — PREDNISONE 50 MG PO TABS: 50.0000 mg | ORAL_TABLET | Freq: Every day | ORAL | 0 refills | Status: DC

## 2016-10-06 NOTE — ED Provider Notes (Signed)
[facility] Emergency Department Provider Note  ____________________________________________  Time seen: Approximately 6:25 PM  I have reviewed the triage vital signs and the nursing notes.   HISTORY  Chief Complaint Cough    HPI Bethany Mitchell is a 35 y.o. female who presents to emergency department complaining of nasal congestion, sore throat, cough. Patient states that symptoms began like a normal cold with progressed to include significant coughing and mild shortness of breath. Patient has tried multiple over-the-counter medications including cough syrups, Robitussin, Mucinex, allergy medications with no relief. Patient denies any fevers or chills, difficulty breathing or swallowing, chest pain, abdominal pain, nausea or vomiting.Patient does report audible wheezing.   Past Medical History:  Diagnosis Date  . Anemia    blood transfusion 2003  . Frequent headaches   . Kidney stone   . Thyroid disease     Patient Active Problem List   Diagnosis Date Noted  . 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 Obese 09/11/2015  . Sinus pressure 09/11/2015  . Encounter to establish care 02/18/2015  . Thyroid activity decreased 02/18/2015  . Anorectal skin tags 08/01/2014    Past Surgical History:  Procedure Laterality Date  . ABDOMINAL HYSTERECTOMY     Partial  . FACIAL RECONSTRUCTION SURGERY  May 2003  . LAPAROSCOPIC HYSTERECTOMY  2011    Prior to Admission medications   Medication Sig Start Date End Date Taking? Authorizing Provider  albuterol (PROVENTIL HFA;VENTOLIN HFA) 108 (90 Base) MCG/ACT inhaler Inhale 2 puffs into the lungs every 4 (four) hours as needed for wheezing or shortness of breath. 10/06/16   Charline Bills Callee Rohrig, PA-C  azithromycin (ZITHROMAX Z-PAK) 250 MG tablet Take 2 tablets (500 mg) on  Day 1,  followed by 1 tablet (250 mg) once daily on Days 2 through 5. 10/06/16   Roderic Palau D Rabab Currington, PA-C  benzonatate (TESSALON) 200 MG capsule Take 1  capsule (200 mg total) by mouth 4 (four) times daily as needed for cough. 10/06/16   Charline Bills Emanuel Dowson, PA-C  cetirizine (ZYRTEC) 10 MG tablet Take 10 mg by mouth daily. Reported on 03/26/2016    Historical Provider, MD  doxycycline (VIBRA-TABS) 100 MG tablet Take 1 tablet (100 mg total) by mouth 2 (two) times daily. 03/26/16   Rubbie Battiest, NP  fluticasone (FLONASE) 50 MCG/ACT nasal spray Place 1 spray into both nostrils 2 (two) times daily. 10/06/16   Charline Bills Lianny Molter, PA-C  levothyroxine (SYNTHROID, LEVOTHROID) 50 MCG tablet Take 50 mcg by mouth daily before breakfast. Reported on 03/26/2016    Historical Provider, MD  magic mouthwash w/lidocaine SOLN Take 5 mLs by mouth 4 (four) times daily. 10/06/16   Charline Bills Percy Winterrowd, PA-C  phentermine (ADIPEX-P) 37.5 MG tablet Take 1 tablet (37.5 mg total) by mouth daily before breakfast. 03/26/16   Rubbie Battiest, NP  predniSONE (DELTASONE) 50 MG tablet Take 1 tablet (50 mg total) by mouth daily with breakfast. 10/06/16   Charline Bills Rose Hippler, PA-C    Allergies Patient has no known allergies.  Family History  Problem Relation Age of Onset  . Diabetes Father   . Thyroid disease Mother   . Depression Mother   . Anxiety disorder Mother   . Cancer Maternal Grandmother     breast cancer  . Thyroid disease Maternal Grandmother   . Breast cancer Maternal Grandmother   . Diabetes Maternal Grandfather     Social History Social History  Substance Use Topics  . Smoking status: Never Smoker  . Smokeless  tobacco: Never Used  . Alcohol use 0.0 oz/week     Comment: occasionally     Review of Systems  Constitutional: No fever/chills Eyes: No visual changes. No discharge ENT: As of nasal congestion and sore throat. Cardiovascular: no chest pain. Respiratory: Positive for cough. Endorses mild shortness of breath. Gastrointestinal: No abdominal pain.  No nausea, no vomiting.  Musculoskeletal: Negative for musculoskeletal pain. Skin: Negative for  rash, abrasions, lacerations, ecchymosis. Neurological: Negative for headaches, focal weakness or numbness. 10-point ROS otherwise negative.  ____________________________________________   PHYSICAL EXAM:  VITAL SIGNS: ED Triage Vitals  Enc Vitals Group     BP 10/06/16 1732 124/83     Pulse Rate 10/06/16 1732 68     Resp 10/06/16 1732 16     Temp 10/06/16 1732 98.4 F (36.9 C)     Temp src --      SpO2 10/06/16 1732 100 %     Weight 10/06/16 1731 170 lb (77.1 kg)     Height 10/06/16 1731 5\' 2"  (1.575 m)     Head Circumference --      Peak Flow --      Pain Score 10/06/16 1732 0     Pain Loc --      Pain Edu? --      Excl. in [place]? --      Constitutional: Alert and oriented. Well appearing and in no acute distress. Eyes: Conjunctivae are normal. PERRL. EOMI. Head: Atraumatic. ENT:      Ears: EACs and TMs are unremarkable bilaterally.      Nose: Mild congestion/rhinnorhea. Turbinates are erythematous and edematous.      Mouth/Throat: Mucous membranes are moist. Pharynx is not erythematous but nonedematous. Uvula is midline. Tonsils are unremarkable bilaterally. Neck: No stridor. Neck is supple for range of motion Hematological/Lymphatic/Immunilogical: Diffuse, mobile, nontender anterior cervical lymphadenopathy. Cardiovascular: Normal rate, regular rhythm. Normal S1 and S2.  Good peripheral circulation. Respiratory: Normal respiratory effort without tachypnea or retractions. Lungs with scattered expiratory wheezing all lung fields. No rales or rhonchi.Kermit Balo air entry to the bases with no decreased or absent breath sounds. Musculoskeletal: Full range of motion to all extremities. No gross deformities appreciated. Neurologic:  Normal speech and language. No gross focal neurologic deficits are appreciated.  Skin:  Skin is warm, dry and intact. No rash noted. Psychiatric: Mood and affect are normal. Speech and behavior are normal. Patient exhibits appropriate insight and  judgement.   ____________________________________________   LABS (all labs ordered are listed, but only abnormal results are displayed)  Labs Reviewed - No data to display ____________________________________________  EKG   ____________________________________________  RADIOLOGY   No results found.  ____________________________________________    PROCEDURES  Procedure(s) performed:    Procedures    Medications  albuterol (PROVENTIL) (2.5 MG/3ML) 0.083% nebulizer solution 2.5 mg (2.5 mg Nebulization Given 10/06/16 1756)  methylPREDNISolone sodium succinate (SOLU-MEDROL) 125 mg/2 mL injection 125 mg (125 mg Intramuscular Given 10/06/16 1756)     ____________________________________________   INITIAL IMPRESSION / ASSESSMENT AND PLAN / ED COURSE  Pertinent labs & imaging results that were available during my care of the patient were reviewed by me and considered in my medical decision making (see chart for details).  Review of the  [place]RS was performed in accordance of the [place] prior to dispensing any controlled drugs.  Clinical Course     Patient's diagnosis is consistent with Acute bronchitis and viral upper respiratory infection. Patient was given steroid injection and breathing treatment in  emergency department. This did improve her symptoms somewhat. Patient's symptoms have been ongoing 10 days. Due to the length of course, patient will be placed on steroids, albuterol inhaler, antibiotics, cough medication, Flonase.. Patient will follow-up with primary care as needed. Patient is given ED precautions to return to the ED for any worsening or new symptoms.     ____________________________________________  FINAL CLINICAL IMPRESSION(S) / ED DIAGNOSES  Final diagnoses:  Acute bronchitis, unspecified organism  Viral URI      NEW MEDICATIONS STARTED DURING THIS VISIT:  Discharge Medication List as of 10/06/2016  6:04 PM    START taking these  medications   Details  albuterol (PROVENTIL HFA;VENTOLIN HFA) 108 (90 Base) MCG/ACT inhaler Inhale 2 puffs into the lungs every 4 (four) hours as needed for wheezing or shortness of breath., Starting Tue 10/06/2016, Print    azithromycin (ZITHROMAX Z-PAK) 250 MG tablet Take 2 tablets (500 mg) on  Day 1,  followed by 1 tablet (250 mg) once daily on Days 2 through 5., Print    benzonatate (TESSALON) 200 MG capsule Take 1 capsule (200 mg total) by mouth 4 (four) times daily as needed for cough., Starting Tue 10/06/2016, Print    fluticasone (FLONASE) 50 MCG/ACT nasal spray Place 1 spray into both nostrils 2 (two) times daily., Starting Tue 10/06/2016, Print    magic mouthwash w/lidocaine SOLN Take 5 mLs by mouth 4 (four) times daily., Starting Tue 10/06/2016, Print    predniSONE (DELTASONE) 50 MG tablet Take 1 tablet (50 mg total) by mouth daily with breakfast., Starting Tue 10/06/2016, Print            This chart was dictated using voice recognition software/Dragon. Despite best efforts to proofread, errors can occur which can change the meaning. Any change was purely unintentional.    Darletta Moll, PA-C 10/06/16 [place], MD 10/06/16 1836

## 2016-10-06 NOTE — ED Notes (Signed)
See triage note   Sinus pressure and occasional cough  Afebrile on arrival  PA in room on arrival to treatment area

## 2016-10-06 NOTE — ED Triage Notes (Signed)
Sinus congestion, sore throat, cough x 1 week.  Initially cough was productive, now cough more dry.  NAD.

## 2017-09-12 IMAGING — US US BREAST*L* LIMITED INC AXILLA
1 series · 7 of 7 positions shown · non-contrast
Comparison: None

CLINICAL DATA: The patient noted discomfort and swelling within the
left low axillary region . There is no family history of breast
cancer.

EXAM:
2D DIGITAL DIAGNOSTIC BILATERAL MAMMOGRAM WITH CAD AND ADJUNCT TOMO
ULTRASOUND BILATERAL BREAST

[Series 1: us breast*left* limited inc axilla · 0.08mm/px · 7 of 7 slices shown]
[im 1/7]
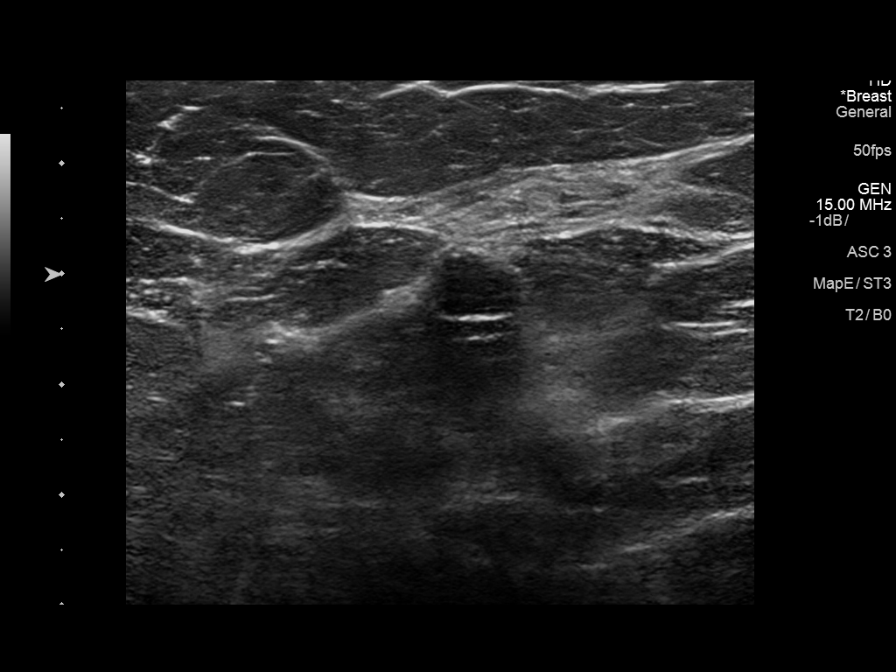
[im 2/7]
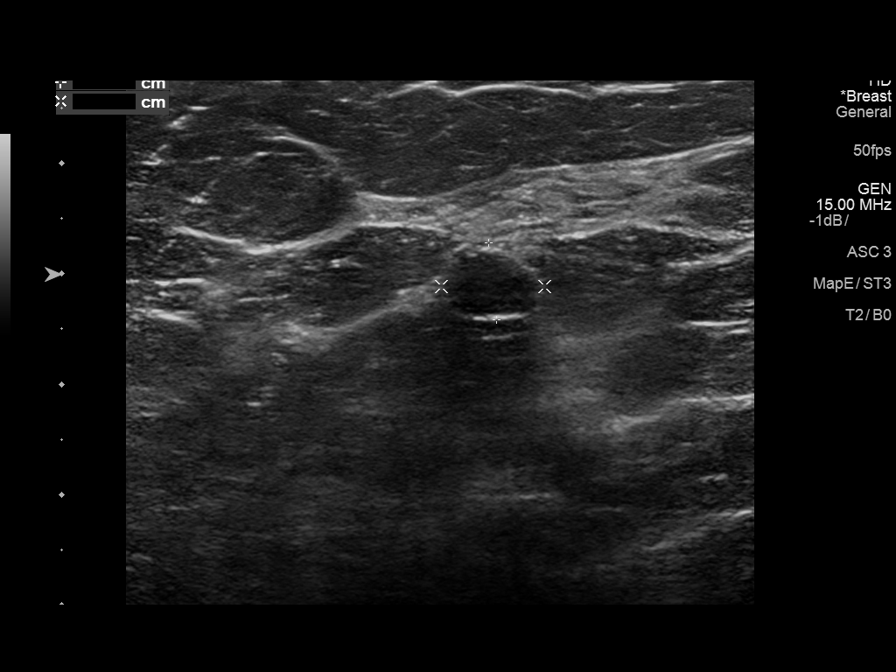
[im 3/7]
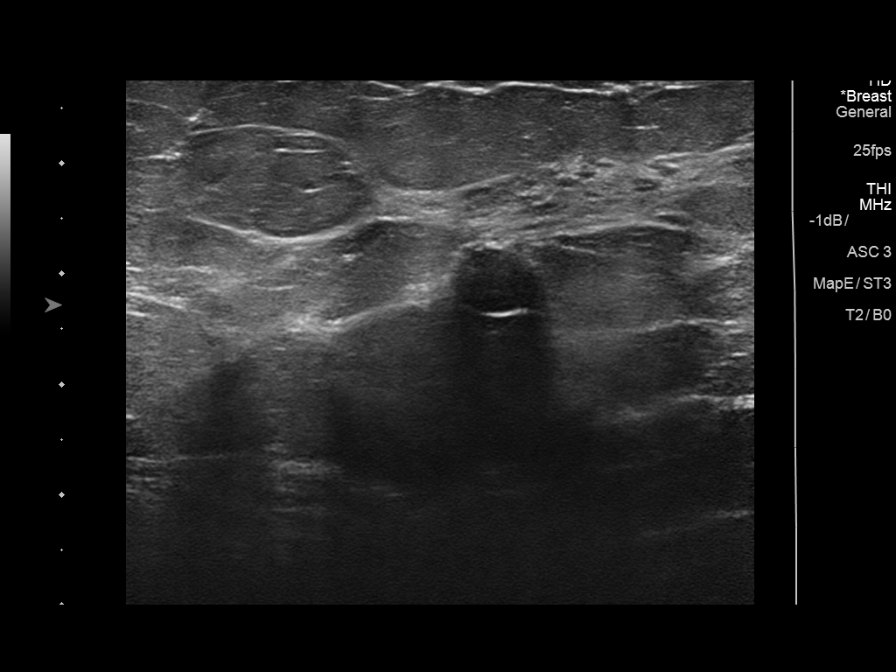
[im 4/7]
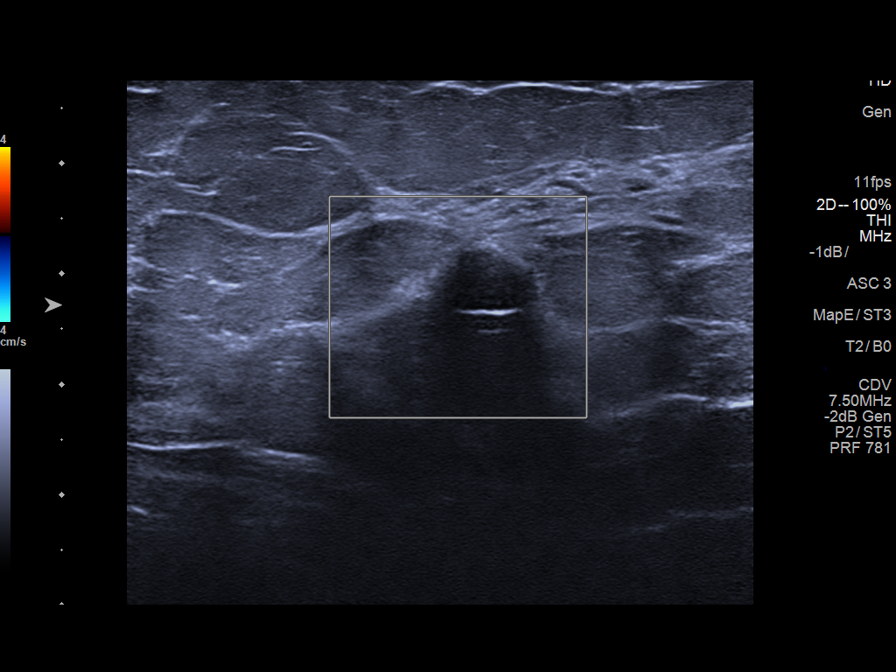
[im 5/7]
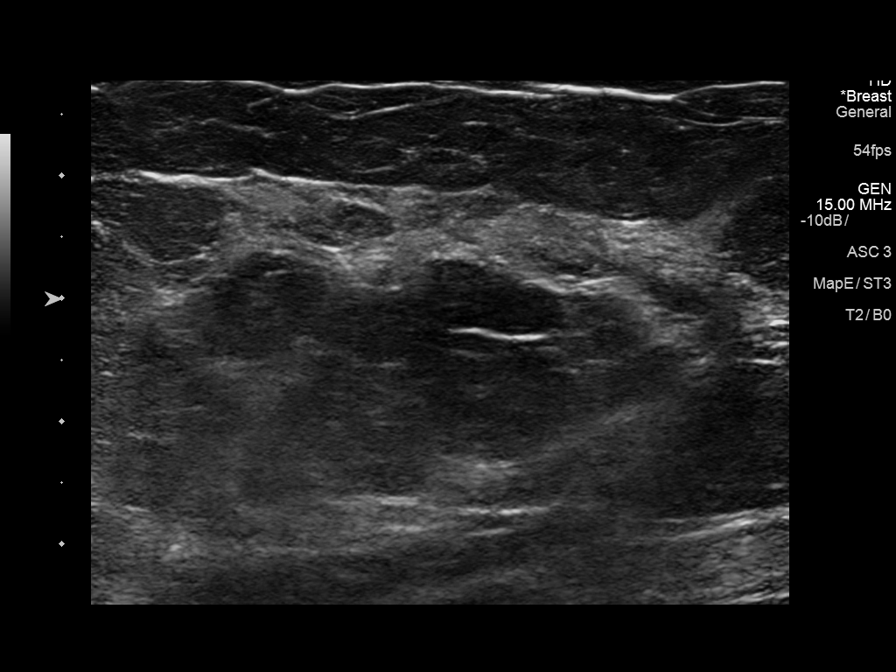
[im 6/7]
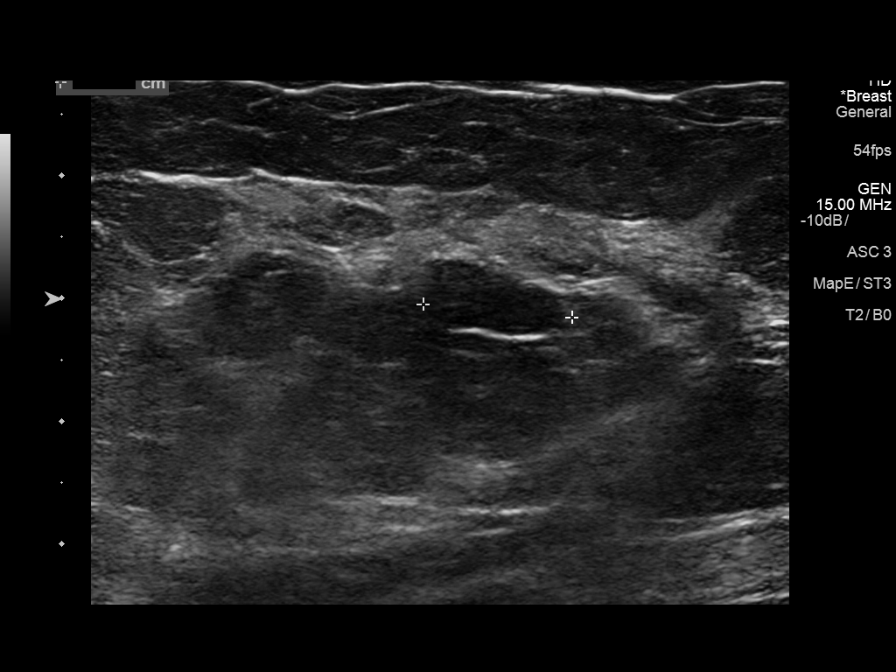
[im 7/7]
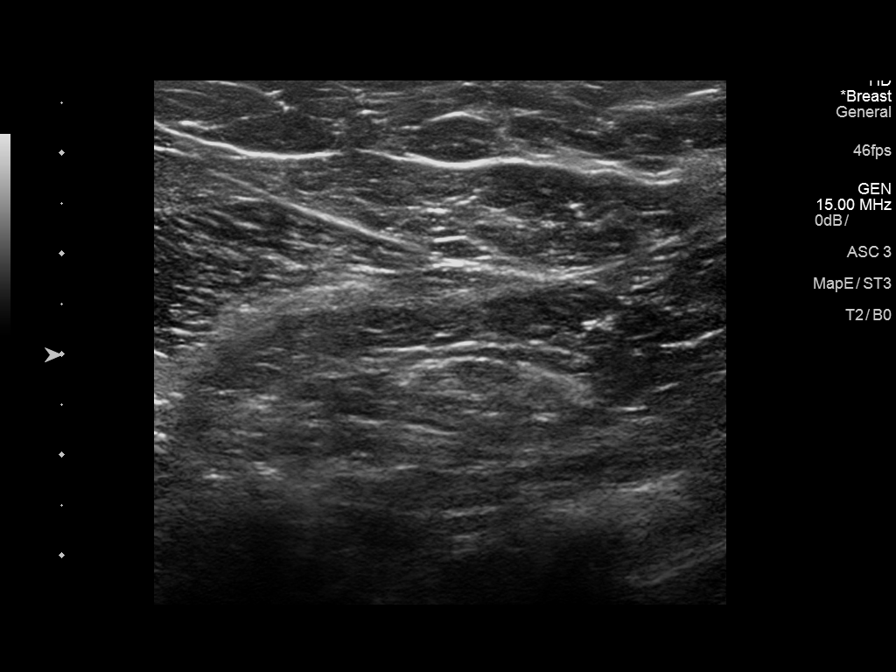

[7 of 7 positions shown; findings below may reference images not displayed]

ACR Breast Density Category b: There are scattered areas of
fibroglandular density.
FINDINGS: There is an oval, circumscribed, mass located within the left breast
at the 12 o'clock position - posterior depth. In the area of
fullness and tenderness within the low left axilla there is
predominately fatty tissue. There is a small, oval, circumscribed
nodule located within the upper outer quadrant of the right breast
with central lucency consistent with a small benign intramammary
lymph node. There is no distortion or worrisome calcification within
either breast.

Mammographic images were processed with CAD.

On physical exam, there is no discrete palpable abnormality within
the superior left breast. There is soft fullness within the left
axilla without a discrete palpable mass or palpable adenopathy.

Targeted ultrasound is performed, showing an oval, circumscribed,
parallel hypoechoic mass located within the left breast at the 12
o'clock position 6 cm from the nipple corresponding to the
circumscribed mass in this region on mammography. This measures
x 0.9 x 0.7 cm size. Most likely this represents a benign
fibroadenoma. I have discussed options of 6, 12, 24 month follow-up
ultrasound evaluation versus tissue sampling via ultrasound-guided
core biopsy. Following this discussion, the patient elects imaging
followup evaluation and I agree with this plan. The importance of
interim Breast self-examination was reviewed with the patient.

Ultrasound of the left axilla demonstrates normal appearing fatty
tissue. There is no mass or adenopathy.

Ultrasound of the upper-outer quadrant of the right breast
demonstrates no ultrasound correlate for the small oval
circumscribed nodule consistent with a benign intramammary lymph
node seen on mammography.
IMPRESSION: 1. 1.2 cm probably benign mass (probable fibroadenoma) located
within the left breast at the 12 o'clock position 6 cm from the
nipple. Recommend follow-up left breast ultrasound in 6 months.
2. Soft fullness with tenderness within the left axilla corresponds
to normal appearing fatty tissue in this region. No evidence for a
left axillary mass or adenopathy.
3. Small benign-appearing intramammary lymph node located within the
upper-outer quadrant of the right breast.

RECOMMENDATION:
Left breast ultrasound in 6 months. Monthly breast self-examination.

I have discussed the findings and recommendations with the patient.
Results were also provided in writing at the conclusion of the
visit. If applicable, a reminder letter will be sent to the patient
regarding the next appointment.

BI-RADS CATEGORY  3: Probably benign.

## 2018-03-24 ENCOUNTER — Encounter: Payer: Self-pay | Admitting: Internal Medicine

## 2018-03-24 ENCOUNTER — Ambulatory Visit: Payer: BC Managed Care – PPO | Admitting: Internal Medicine

## 2018-03-24 VITALS — BP 112/70 | HR 73 | Temp 98.0°F | Ht 62.0 in | Wt 209.6 lb

## 2018-03-24 DIAGNOSIS — R946 Abnormal results of thyroid function studies: Secondary | ICD-10-CM

## 2018-03-24 DIAGNOSIS — Z1159 Encounter for screening for other viral diseases: Secondary | ICD-10-CM

## 2018-03-24 DIAGNOSIS — E669 Obesity, unspecified: Secondary | ICD-10-CM | POA: Insufficient documentation

## 2018-03-24 DIAGNOSIS — Z0184 Encounter for antibody response examination: Secondary | ICD-10-CM

## 2018-03-24 DIAGNOSIS — Z0000 Encounter for general adult medical examination without abnormal findings: Secondary | ICD-10-CM

## 2018-03-24 DIAGNOSIS — Z13220 Encounter for screening for lipoid disorders: Secondary | ICD-10-CM | POA: Diagnosis not present

## 2018-03-24 DIAGNOSIS — Z1283 Encounter for screening for malignant neoplasm of skin: Secondary | ICD-10-CM

## 2018-03-24 DIAGNOSIS — E559 Vitamin D deficiency, unspecified: Secondary | ICD-10-CM | POA: Diagnosis not present

## 2018-03-24 DIAGNOSIS — Z1389 Encounter for screening for other disorder: Secondary | ICD-10-CM | POA: Diagnosis not present

## 2018-03-24 LAB — CBC WITH DIFFERENTIAL/PLATELET
BASOS PCT: 0.5 % (ref 0.0–3.0)
Basophils Absolute: 0 10*3/uL (ref 0.0–0.1)
EOS ABS: 0.1 10*3/uL (ref 0.0–0.7)
EOS PCT: 1 % (ref 0.0–5.0)
HEMATOCRIT: 40.4 % (ref 36.0–46.0)
Hemoglobin: 13.7 g/dL (ref 12.0–15.0)
LYMPHS PCT: 35 % (ref 12.0–46.0)
Lymphs Abs: 2.1 10*3/uL (ref 0.7–4.0)
MCHC: 33.9 g/dL (ref 30.0–36.0)
MCV: 86.5 fl (ref 78.0–100.0)
Monocytes Absolute: 0.4 10*3/uL (ref 0.1–1.0)
Monocytes Relative: 6.7 % (ref 3.0–12.0)
Neutro Abs: 3.4 10*3/uL (ref 1.4–7.7)
Neutrophils Relative %: 56.8 % (ref 43.0–77.0)
Platelets: 230 10*3/uL (ref 150.0–400.0)
RBC: 4.67 Mil/uL (ref 3.87–5.11)
RDW: 13.3 % (ref 11.5–15.5)
WBC: 6 10*3/uL (ref 4.0–10.5)

## 2018-03-24 LAB — COMPREHENSIVE METABOLIC PANEL
ALT: 18 U/L (ref 0–35)
AST: 20 U/L (ref 0–37)
Albumin: 4.1 g/dL (ref 3.5–5.2)
Alkaline Phosphatase: 102 U/L (ref 39–117)
BUN: 10 mg/dL (ref 6–23)
CALCIUM: 8.9 mg/dL (ref 8.4–10.5)
CHLORIDE: 106 meq/L (ref 96–112)
CO2: 28 meq/L (ref 19–32)
CREATININE: 0.71 mg/dL (ref 0.40–1.20)
GFR: 99.86 mL/min (ref >60.00)
Glucose, Bld: 89 mg/dL (ref 70–99)
POTASSIUM: 3.9 meq/L (ref 3.5–5.1)
Sodium: 141 mEq/L (ref 135–145)
Total Bilirubin: 0.6 mg/dL (ref 0.2–1.2)
Total Protein: 6.7 g/dL (ref 6.0–8.3)

## 2018-03-24 LAB — T3, FREE: T3, Free: 3.7 pg/mL (ref 2.3–4.2)

## 2018-03-24 LAB — T4, FREE: FREE T4: 0.71 ng/dL (ref 0.60–1.60)

## 2018-03-24 LAB — LIPID PANEL
CHOL/HDL RATIO: 3
Cholesterol: 142 mg/dL (ref 0–200)
HDL: 51.6 mg/dL (ref >39.00)
LDL CALC: 77 mg/dL (ref 0–99)
NONHDL: 90.43
TRIGLYCERIDES: 65 mg/dL (ref 0.0–149.0)
VLDL: 13 mg/dL (ref 0.0–40.0)

## 2018-03-24 LAB — TSH: TSH: 6.52 u[IU]/mL — ABNORMAL HIGH (ref 0.35–4.50)

## 2018-03-24 LAB — VITAMIN D 25 HYDROXY (VIT D DEFICIENCY, FRACTURES): VITD: 24.19 ng/mL — ABNORMAL LOW (ref 30.00–100.00)

## 2018-03-24 MED ORDER — PHENTERMINE HCL 37.5 MG PO TABS: 37.5000 mg | ORAL_TABLET | Freq: Every day | ORAL | 0 refills | Status: DC

## 2018-03-24 NOTE — Patient Instructions (Signed)
Follow up in 1 month   Exercising to Lose Weight Exercising can help you to lose weight. In order to lose weight through exercise, you need to do vigorous-intensity exercise. You can tell that you are exercising with vigorous intensity if you are breathing very hard and fast and cannot hold a conversation while exercising. Moderate-intensity exercise helps to maintain your current weight. You can tell that you are exercising at a moderate level if you have a higher heart rate and faster breathing, but you are still able to hold a conversation. How often should I exercise? Choose an activity that you enjoy and set realistic goals. Your health care provider can help you to make an activity plan that works for you. Exercise regularly as directed by your health care provider. This may include:  Doing resistance training twice each week, such as: ? Push-ups. ? Sit-ups. ? Lifting weights. ? Using resistance bands.  Doing a given intensity of exercise for a given amount of time. Choose from these options: ? 150 minutes of moderate-intensity exercise every week. ? 75 minutes of vigorous-intensity exercise every week. ? A mix of moderate-intensity and vigorous-intensity exercise every week.  Children, pregnant women, people who are out of shape, people who are overweight, and older adults may need to consult a health care provider for individual recommendations. If you have any sort of medical condition, be sure to consult your health care provider before starting a new exercise program. What are some activities that can help me to lose weight?  Walking at a rate of at least 4.5 miles an hour.  Jogging or running at a rate of 5 miles per hour.  Biking at a rate of at least 10 miles per hour.  Lap swimming.  Roller-skating or in-line skating.  Cross-country skiing.  Vigorous competitive sports, such as football, basketball, and soccer.  Jumping rope.  Aerobic dancing. How can I be more  active in my day-to-day activities?  Use the stairs instead of the elevator.  Take a walk during your lunch break.  If you drive, park your car farther away from work or school.  If you take public transportation, get off one stop early and walk the rest of the way.  Make all of your phone calls while standing up and walking around.  Get up, stretch, and walk around every 30 minutes throughout the day. What guidelines should I follow while exercising?  Do not exercise so much that you hurt yourself, feel dizzy, or get very short of breath.  Consult your health care provider prior to starting a new exercise program.  Wear comfortable clothes and shoes with good support.  Drink plenty of water while you exercise to prevent dehydration or heat stroke. Body water is lost during exercise and must be replaced.  Work out until you breathe faster and your heart beats faster. This information is not intended to replace advice given to you by your health care provider. Make sure you discuss any questions you have with your health care provider. Document Released: 12/19/2010 Document Revised: 04/23/2016 Document Reviewed: 04/19/2014 Elsevier Interactive Patient Education  2018 [employer].  HPV (Human Papillomavirus) Vaccine: What You Need to Know 1. Why get vaccinated? HPV vaccine prevents infection with human papillomavirus (HPV) types that are associated with many cancers, including:  cervical cancer in females,  vaginal and vulvar cancers in females,  anal cancer in females and males,  throat cancer in females and males, and  penile cancer in males.  In addition, HPV vaccine prevents infection with HPV types that cause genital warts in both females and males. In the [place] about 12,000 women get cervical cancer every year, and about 4,000 women die from it. HPV vaccine can prevent most of these cases of cervical cancer. Vaccination is not a substitute for cervical cancer  screening. This vaccine does not protect against all HPV types that can cause cervical cancer. Women should still get regular Pap tests. HPV infection usually comes from sexual contact, and most people will become infected at some point in their life. About 14 million Americans, including teens, get infected every year. Most infections will go away on their own and not cause serious problems. But thousands of women and men get cancer and other diseases from HPV. 2. HPV vaccine HPV vaccine is approved by FDA and is recommended by CDC for both males and females. It is routinely given at 27 or 35 years of age, but it may be given beginning at age 62 years through age 40 years. Most adolescents 9 through 35 years of age should get HPV vaccine as a two-dose series with the doses separated by 6-12 months. People who start HPV vaccination at 66 years of age and older should get the vaccine as a three-dose series with the second dose given 1-2 months after the first dose and the third dose given 6 months after the first dose. There are several exceptions to these age recommendations. Your health care provider can give you more information. 3. Some people should not get this vaccine  Anyone who has had a severe (life-threatening) allergic reaction to a dose of HPV vaccine should not get another dose.  Anyone who has a severe (life threatening) allergy to any component of HPV vaccine should not get the vaccine.  Tell your doctor if you have any severe allergies that you know of, including a severe allergy to yeast.  HPV vaccine is not recommended for pregnant women. If you learn that you were pregnant when you were vaccinated, there is no reason to expect any problems for you or your baby. Any woman who learns she was pregnant when she got HPV vaccine is encouraged to contact the manufacturer's registry for HPV vaccination during pregnancy at [phone]. Women who are breastfeeding may be vaccinated.  If you  have a mild illness, such as a cold, you can probably get the vaccine today. If you are moderately or severely ill, you should probably wait until you recover. Your doctor can advise you. 4. Risks of a vaccine reaction With any medicine, including vaccines, there is a chance of side effects. These are usually mild and go away on their own, but serious reactions are also possible. Most people who get HPV vaccine do not have any serious problems with it. Mild or moderate problems following HPV vaccine:  Reactions in the arm where the shot was given: ? Soreness (about 9 people in 10) ? Redness or swelling (about 1 person in 3)  Fever: ? Mild (100F) (about 1 person in 10) ? Moderate (102F) (about 1 person in 106)  Other problems: ? Headache (about 1 person in 3) Problems that could happen after any injected vaccine:  People sometimes faint after a medical procedure, including vaccination. Sitting or lying down for about 15 minutes can help prevent fainting, and injuries caused by a fall. Tell your doctor if you feel dizzy, or have vision changes or ringing in the ears.  Some people get severe pain in  the shoulder and have difficulty moving the arm where a shot was given. This happens very rarely.  Any medication can cause a severe allergic reaction. Such reactions from a vaccine are very rare, estimated at about 1 in a million doses, and would happen within a few minutes to a few hours after the vaccination. As with any medicine, there is a very remote chance of a vaccine causing a serious injury or death. The safety of vaccines is always being monitored. For more information, visit: http://www.aguilar.org/. 5. What if there is a serious reaction? What should I look for? Look for anything that concerns you, such as signs of a severe allergic reaction, very high fever, or unusual behavior. Signs of a severe allergic reaction can include hives, swelling of the face and throat, difficulty  breathing, a fast heartbeat, dizziness, and weakness. These would usually start a few minutes to a few hours after the vaccination. What should I do? If you think it is a severe allergic reaction or other emergency that can't wait, call 9-1-1 or get to the nearest hospital. Otherwise, call your doctor. Afterward, the reaction should be reported to the Vaccine Adverse Event Reporting System (VAERS). Your doctor should file this report, or you can do it yourself through the VAERS web site at www.vaers.SamedayNews.es, or by calling (669)619-9490. VAERS does not give medical advice. 6. The National Vaccine Injury Compensation Program The [employer] Vaccine Injury Compensation Program (VICP) is a federal program that was created to compensate people who may have been injured by certain vaccines. Persons who believe they may have been injured by a vaccine can learn about the program and about filing a claim by calling 918-610-0165 or visiting the [place] website at GoldCloset.com.ee. There is a time limit to file a claim for compensation. 7. How can I learn more?  Ask your health care provider. He or she can give you the vaccine package insert or suggest other sources of information.  Call your local or state health department.  Contact the Centers for Disease Control and Prevention (CDC): ? Call (820) 073-3094 (1-800-CDC-INFO) or ? Visit CDC's website at http://sweeney-todd.com/ Vaccine Information Statement, HPV Vaccine (11/01/2015) This information is not intended to replace advice given to you by your health care provider. Make sure you discuss any questions you have with your health care provider. Document Released: 06/13/2014 Document Revised: 08/06/2016 Document Reviewed: 08/06/2016 Elsevier Interactive Patient Education  2017 [employer].

## 2018-03-24 NOTE — Progress Notes (Signed)
Chief Complaint  Patient presents with  . Establish Care   Establish care  1. Obesity exercising 15 minutes 3x per week she has reduced fried foods, carbs, cut back on soda doing 1 diet soda per day. She was 89 in Aug 2018. 2. Irritated mole to mid back itchy at times and irritated refer to Dr. Nehemiah Massed    Review of Systems  Constitutional: Negative for weight loss.  HENT: Negative for hearing loss.   Eyes: Negative for blurred vision.  Respiratory: Negative for shortness of breath.   Cardiovascular: Negative for chest pain.  Gastrointestinal: Negative for abdominal pain.  Musculoskeletal: Negative for falls.  Skin: Negative for rash.       +skin lesion mid back   Neurological: Negative for headaches.  Psychiatric/Behavioral: Negative for depression.   Past Medical History:  Diagnosis Date  . Anemia    blood transfusion 2003  . Frequent headaches   . Kidney stone   . Thyroid disease    Past Surgical History:  Procedure Laterality Date  . ABDOMINAL HYSTERECTOMY     Partial  . FACIAL RECONSTRUCTION SURGERY  May 2003  . LAPAROSCOPIC HYSTERECTOMY  2011   Family History  Problem Relation Age of Onset  . Diabetes Father   . Thyroid disease Mother   . Depression Mother   . Anxiety disorder Mother   . Cancer Maternal Grandmother        breast cancer  . Thyroid disease Maternal Grandmother   . Breast cancer Maternal Grandmother   . Diabetes Maternal Grandfather    Social History   Socioeconomic History  . Marital status: Married    Spouse name: Not on file  . Number of children: Not on file  . Years of education: Not on file  . Highest education level: Not on file  Occupational History  . Not on file  Social Needs  . Financial resource strain: Not on file  . Food insecurity:    Worry: Not on file    Inability: Not on file  . Transportation needs:    Medical: Not on file    Non-medical: Not on file  Tobacco Use  . Smoking status: Never Smoker  . Smokeless  tobacco: Never Used  Substance and Sexual Activity  . Alcohol use: Yes    Alcohol/week: 0.0 oz    Comment: occasionally  . Drug use: No  . Sexual activity: Not Currently  Lifestyle  . Physical activity:    Days per week: Not on file    Minutes per session: Not on file  . Stress: Not on file  Relationships  . Social connections:    Talks on phone: Not on file    Gets together: Not on file    Attends religious service: Not on file    Active member of club or organization: Not on file    Attends meetings of clubs or organizations: Not on file    Relationship status: Not on file  . Intimate partner violence:    Fear of current or ex partner: Not on file    Emotionally abused: Not on file    Physically abused: Not on file    Forced sexual activity: Not on file  Other Topics Concern  . Not on file  Social History Narrative   Works for school system   Lives at home with husband and 2 girls   Pets 2 dogs and 2 fish    Left handed   Some college    Enjoys  being with the family    No outpatient medications have been marked as taking for the 03/24/18 encounter (Office Visit) with McLean-Scocuzza, Nino Glow, MD.   No Known Allergies No results found for this or any previous visit (from the past 2160 hour(s)). Objective  Body mass index is 38.34 kg/m. Wt Readings from Last 3 Encounters:  03/24/18 209 lb 9.6 oz (95.1 kg)  10/06/16 170 lb (77.1 kg)  03/26/16 198 lb (89.8 kg)   Temp Readings from Last 3 Encounters:  03/24/18 98 F (36.7 C)  10/06/16 98.4 F (36.9 C)  03/26/16 98.2 F (36.8 C) (Oral)   BP Readings from Last 3 Encounters:  03/24/18 112/70  10/06/16 124/83  03/26/16 104/72   Pulse Readings from Last 3 Encounters:  03/24/18 73  10/06/16 68  03/26/16 68    Physical Exam  Constitutional: She is oriented to person, place, and time. Vital signs are normal. She appears well-developed and well-nourished. She is cooperative.  HENT:  Head: Normocephalic and  atraumatic.  Mouth/Throat: Oropharynx is clear and moist and mucous membranes are normal.  Eyes: Pupils are equal, round, and reactive to light. Conjunctivae are normal.  Cardiovascular: Normal rate, regular rhythm and normal heart sounds.  Pulmonary/Chest: Effort normal and breath sounds normal.  Neurological: She is alert and oriented to person, place, and time. Gait normal.  Skin: Skin is warm, dry and intact.     Psychiatric: She has a normal mood and affect. Her speech is normal and behavior is normal. Judgment and thought content normal. Cognition and memory are normal.  Nursing note and vitals reviewed.   Assessment   1. Obesity BMI >38  2. Irritated mole to back  3. HM Plan  1. 1/3 adipex f/u in 33month  rec healthy diet choices and exercise  2. Refer to Dr. Raliegh Ip tbse and remove mole to back  3.  Had flu shot 9 or 08/2017  Tdap 02/18/11  HPV vaccine given info  Labs today   Will do pap at f/u and breast exam and physical  -s/p hysterectomy 10/2010 still has cervix   Provider: Dr. Olivia Mackie McLean-Scocuzza-Internal Medicine

## 2018-03-25 ENCOUNTER — Telehealth: Payer: Self-pay | Admitting: Internal Medicine

## 2018-03-25 ENCOUNTER — Other Ambulatory Visit: Payer: Self-pay | Admitting: Internal Medicine

## 2018-03-25 DIAGNOSIS — E039 Hypothyroidism, unspecified: Secondary | ICD-10-CM

## 2018-03-25 DIAGNOSIS — E038 Other specified hypothyroidism: Secondary | ICD-10-CM

## 2018-03-25 LAB — URINALYSIS, ROUTINE W REFLEX MICROSCOPIC
Bilirubin, UA: NEGATIVE
GLUCOSE, UA: NEGATIVE
Ketones, UA: NEGATIVE
LEUKOCYTES UA: NEGATIVE
Nitrite, UA: NEGATIVE
PROTEIN UA: NEGATIVE
RBC, UA: NEGATIVE
Specific Gravity, UA: 1.019 (ref 1.005–1.030)
Urobilinogen, Ur: 0.2 mg/dL (ref 0.2–1.0)
pH, UA: 6 (ref 5.0–7.5)

## 2018-03-25 LAB — HEPATITIS B SURFACE ANTIBODY, QUANTITATIVE: Hepatitis B-Post: <5 m[IU]/mL — ABNORMAL LOW (ref >=10)

## 2018-03-25 LAB — THYROID PEROXIDASE ANTIBODY: Thyroperoxidase Ab SerPl-aCnc: 170 IU/mL — ABNORMAL HIGH (ref <9)

## 2018-03-25 MED ORDER — LEVOTHYROXINE SODIUM 25 MCG PO TABS: 25.0000 ug | ORAL_TABLET | Freq: Every day | ORAL | 2 refills | Status: DC

## 2018-03-25 NOTE — Telephone Encounter (Signed)
See result note.  

## 2018-03-25 NOTE — Telephone Encounter (Signed)
Copied from [place] [phone]. Topic: Quick Communication - See Telephone Encounter >> Mar 25, 2018 11:28 AM Bea Graff, NT wrote: CRM for notification. See Telephone encounter for: 03/25/18. Pt calling back to get lab results.

## 2018-03-28 ENCOUNTER — Encounter: Payer: Self-pay | Admitting: Internal Medicine

## 2018-04-28 ENCOUNTER — Encounter: Payer: Self-pay | Admitting: Internal Medicine

## 2018-04-28 ENCOUNTER — Ambulatory Visit (INDEPENDENT_AMBULATORY_CARE_PROVIDER_SITE_OTHER): Payer: BC Managed Care – PPO | Admitting: Internal Medicine

## 2018-04-28 ENCOUNTER — Other Ambulatory Visit (HOSPITAL_COMMUNITY)
Admission: RE | Admit: 2018-04-28 | Discharge: 2018-04-28 | Disposition: A | Discharge status: Home / Self Care | Payer: BC Managed Care – PPO | Source: Ambulatory Visit | Attending: Internal Medicine | Admitting: Internal Medicine

## 2018-04-28 VITALS — BP 116/76 | HR 78 | Temp 98.2°F | Ht 62.0 in | Wt 205.0 lb

## 2018-04-28 DIAGNOSIS — E669 Obesity, unspecified: Secondary | ICD-10-CM

## 2018-04-28 DIAGNOSIS — Z23 Encounter for immunization: Secondary | ICD-10-CM | POA: Diagnosis not present

## 2018-04-28 DIAGNOSIS — E049 Nontoxic goiter, unspecified: Secondary | ICD-10-CM

## 2018-04-28 DIAGNOSIS — E559 Vitamin D deficiency, unspecified: Secondary | ICD-10-CM | POA: Insufficient documentation

## 2018-04-28 DIAGNOSIS — Z124 Encounter for screening for malignant neoplasm of cervix: Secondary | ICD-10-CM

## 2018-04-28 DIAGNOSIS — N939 Abnormal uterine and vaginal bleeding, unspecified: Secondary | ICD-10-CM | POA: Diagnosis not present

## 2018-04-28 DIAGNOSIS — Z0000 Encounter for general adult medical examination without abnormal findings: Secondary | ICD-10-CM | POA: Insufficient documentation

## 2018-04-28 MED ORDER — PHENTERMINE HCL 37.5 MG PO TABS: 37.5000 mg | ORAL_TABLET | Freq: Every day | ORAL | 0 refills | Status: DC

## 2018-04-28 NOTE — Patient Instructions (Addendum)
Please sch hepatitis B vaccine in 1 month and 6 months   Hepatitis B Vaccine, Recombinant injection What is this medicine? HEPATITIS B VACCINE (hep uh TAHY tis B VAK seen) is a vaccine. It is used to prevent an infection with the hepatitis B virus. This medicine may be used for other purposes; ask your health care provider or pharmacist if you have questions. COMMON BRAND NAME(S): Engerix-B, Recombivax HB What should I tell my health care provider before I take this medicine? They need to know if you have any of these conditions: -fever, infection -heart disease -hepatitis B infection -immune system problems -kidney disease -an unusual or allergic reaction to vaccines, yeast, other medicines, foods, dyes, or preservatives -pregnant or trying to get pregnant -breast-feeding How should I use this medicine? This vaccine is for injection into a muscle. It is given by a health care professional. A copy of Vaccine Information Statements will be given before each vaccination. Read this sheet carefully each time. The sheet may change frequently. Talk to your pediatrician regarding the use of this medicine in children. While this drug may be prescribed for children as young as newborn for selected conditions, precautions do apply. Overdosage: If you think you have taken too much of this medicine contact a poison control center or emergency room at once. NOTE: This medicine is only for you. Do not share this medicine with others. What if I miss a dose? It is important not to miss your dose. Call your doctor or health care professional if you are unable to keep an appointment. What may interact with this medicine? -medicines that suppress your immune function like adalimumab, anakinra, infliximab -medicines to treat cancer -steroid medicines like prednisone or cortisone This list may not describe all possible interactions. Give your health care provider a list of all the medicines, herbs,  non-prescription drugs, or dietary supplements you use. Also tell them if you smoke, drink alcohol, or use illegal drugs. Some items may interact with your medicine. What should I watch for while using this medicine? See your health care provider for all shots of this vaccine as directed. You must have 3 shots of this vaccine for protection from hepatitis B infection. Tell your doctor right away if you have any serious or unusual side effects after getting this vaccine. What side effects may I notice from receiving this medicine? Side effects that you should report to your doctor or health care professional as soon as possible: -allergic reactions like skin rash, itching or hives, swelling of the face, lips, or tongue -breathing problems -confused, irritated -fast, irregular heartbeat -flu-like syndrome -numb, tingling pain -seizures -unusually weak or tired Side effects that usually do not require medical attention (report to your doctor or health care professional if they continue or are bothersome): -diarrhea -fever -headache -loss of appetite -muscle pain -nausea -pain, redness, swelling, or irritation at site where injected -tiredness This list may not describe all possible side effects. Call your doctor for medical advice about side effects. You may report side effects to FDA at 1-800-FDA-1088. Where should I keep my medicine? This drug is given in a hospital or clinic and will not be stored at home. NOTE: This sheet is a summary. It may not cover all possible information. If you have questions about this medicine, talk to your doctor, pharmacist, or health care provider.  2018 Elsevier/Gold Standard (2014-03-19 13:26:01)

## 2018-04-28 NOTE — Progress Notes (Signed)
Pre visit review using our clinic review tool, if applicable. No additional management support is needed unless otherwise documented below in the visit note. 

## 2018-04-28 NOTE — Progress Notes (Signed)
Chief Complaint  Patient presents with  . Follow-up    pap   Annual  1. Pap today  2. Reviewed labs  3. Obesity lost 4 lbs per our scale and 9-10 lbs per pt scale  4. Subclinical hypothyroidism on levo 25 mcg  5. C/o vaginal spotting intermittently s/p hysterectomy   Review of Systems  Constitutional: Positive for weight loss.  HENT: Negative for hearing loss.   Eyes: Negative for blurred vision.  Respiratory: Negative for shortness of breath.   Cardiovascular: Negative for chest pain.  Gastrointestinal: Negative for abdominal pain.  Genitourinary:       +vaginal spotting s/p hysterectomy   Musculoskeletal: Negative for falls.  Skin: Negative for rash.  Neurological: Negative for headaches.  Psychiatric/Behavioral: Negative for depression.   Past Medical History:  Diagnosis Date  . Anemia    blood transfusion 2003  . Frequent headaches   . Kidney stone   . Subclinical hypothyroidism   . Thyroid disease   . Vaginal bleeding    Past Surgical History:  Procedure Laterality Date  . ABDOMINAL HYSTERECTOMY     Partial  . FACIAL RECONSTRUCTION SURGERY  May 2003  . LAPAROSCOPIC HYSTERECTOMY  2011   11/01/10 still has cervix    Family History  Problem Relation Age of Onset  . Diabetes Father   . Thyroid disease Mother   . Depression Mother   . Anxiety disorder Mother   . Cancer Maternal Grandmother        breast cancer  . Thyroid disease Maternal Grandmother   . Breast cancer Maternal Grandmother   . Diabetes Maternal Grandfather    Social History   Socioeconomic History  . Marital status: Married    Spouse name: Not on file  . Number of children: Not on file  . Years of education: Not on file  . Highest education level: Not on file  Occupational History  . Not on file  Social Needs  . Financial resource strain: Not on file  . Food insecurity:    Worry: Not on file    Inability: Not on file  . Transportation needs:    Medical: Not on file    Non-medical:  Not on file  Tobacco Use  . Smoking status: Never Smoker  . Smokeless tobacco: Never Used  Substance and Sexual Activity  . Alcohol use: Yes    Alcohol/week: 0.0 oz    Comment: occasionally  . Drug use: No  . Sexual activity: Not Currently  Lifestyle  . Physical activity:    Days per week: Not on file    Minutes per session: Not on file  . Stress: Not on file  Relationships  . Social connections:    Talks on phone: Not on file    Gets together: Not on file    Attends religious service: Not on file    Active member of club or organization: Not on file    Attends meetings of clubs or organizations: Not on file    Relationship status: Not on file  . Intimate partner violence:    Fear of current or ex partner: Not on file    Emotionally abused: Not on file    Physically abused: Not on file    Forced sexual activity: Not on file  Other Topics Concern  . 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 Cholecalciferol (D-3-5) 5000 units capsule Take 5,000 Units by mouth daily.  Marland Kitchen levothyroxine (SYNTHROID) 25 MCG tablet Take 1 tablet (25 mcg total) by mouth daily before breakfast.  . phentermine (ADIPEX-P) 37.5 MG tablet Take 1 tablet (37.5 mg total) by mouth daily before breakfast.  . [DISCONTINUED] phentermine (ADIPEX-P) 37.5 MG tablet Take 1 tablet (37.5 mg total) by mouth daily before breakfast.   No Known Allergies Recent Results (from the past 2160 hour(s))  Comprehensive metabolic panel     Status: None   Collection Time: 03/24/18 11:19 AM  Result Value Ref Range   Sodium 141 135 - 145 mEq/L   Potassium 3.9 3.5 - 5.1 mEq/L   Chloride 106 96 - 112 mEq/L   CO2 28 19 - 32 mEq/L   Glucose, Bld 89 70 - 99 mg/dL   BUN 10 6 - 23 mg/dL   Creatinine,  Ser 0.71 0.40 - 1.20 mg/dL   Total Bilirubin 0.6 0.2 - 1.2 mg/dL   Alkaline Phosphatase 102 39 - 117 U/L   AST 20 0 - 37 U/L   ALT 18 0 - 35 U/L   Total Protein 6.7 6.0 - 8.3 g/dL   Albumin 4.1 3.5 - 5.2 g/dL   Calcium 8.9 8.4 - 10.5 mg/dL   GFR 99.86 >60.00 mL/min  CBC with Differential/Platelet     Status: None   Collection Time: 03/24/18 11:19 AM  Result Value Ref Range   WBC 6.0 4.0 - 10.5 K/uL   RBC 4.67 3.87 - 5.11 Mil/uL   Hemoglobin 13.7 12.0 - 15.0 g/dL   HCT 40.4 36.0 - 46.0 %   MCV 86.5 78.0 - 100.0 fl   MCHC 33.9 30.0 - 36.0 g/dL   RDW 13.3 11.5 - 15.5 %   Platelets 230.0 150.0 - 400.0 K/uL   Neutrophils Relative % 56.8 43.0 - 77.0 %   Lymphocytes Relative 35.0 12.0 - 46.0 %   Monocytes Relative 6.7 3.0 - 12.0 %   Eosinophils Relative 1.0 0.0 - 5.0 %   Basophils Relative 0.5 0.0 - 3.0 %   Neutro Abs 3.4 1.4 - 7.7 K/uL   Lymphs Abs 2.1 0.7 - 4.0 K/uL   Monocytes Absolute 0.4 0.1 - 1.0 K/uL   Eosinophils Absolute 0.1 0.0 - 0.7 K/uL   Basophils Absolute 0.0 0.0 - 0.1 K/uL  Urinalysis, Routine w reflex microscopic     Status: None   Collection Time: 03/24/18 11:19 AM  Result Value Ref Range   Specific Gravity, UA 1.019 1.005 - 1.030   pH, UA 6.0 5.0 - 7.5   Color, UA Yellow Yellow   Appearance Ur Clear Clear   Leukocytes, UA Negative Negative   Protein, UA Negative Negative/Trace   Glucose, UA Negative Negative   Ketones, UA Negative Negative   RBC, UA Negative Negative   Bilirubin, UA Negative Negative   Urobilinogen, Ur 0.2 0.2 - 1.0 mg/dL   Nitrite, UA Negative Negative   Microscopic Examination Comment     Comment: Microscopic not indicated and not performed.  TSH     Status: Abnormal   Collection Time: 03/24/18 11:19 AM  Result Value Ref Range   TSH 6.52 (H) 0.35 - 4.50 uIU/mL  T4, free     Status: None   Collection Time: 03/24/18 11:19 AM  Result Value Ref Range   Free T4 0.71 0.60 - 1.60 ng/dL    Comment: Specimens from patients who are undergoing  biotin therapy and /or ingesting biotin supplements may contain high levels of biotin.  The higher biotin concentration in these specimens interferes with this Free T4 assay.  Specimens that contain high levels  of biotin may cause false high results for this Free T4 assay.  Please interpret results in light of the total clinical presentation of the patient.    T3, free     Status: None   Collection Time: 03/24/18 11:19 AM  Result Value Ref Range   T3, Free 3.7 2.3 - 4.2 pg/mL  Thyroid peroxidase antibody     Status: Abnormal   Collection Time: 03/24/18 11:19 AM  Result Value Ref Range   Thyroperoxidase Ab SerPl-aCnc 170 (H) <9 IU/mL  Lipid panel     Status: None   Collection Time: 03/24/18 11:19 AM  Result Value Ref Range   Cholesterol 142 0 - 200 mg/dL    Comment: ATP III Classification       Desirable:  < 200 mg/dL               Borderline High:  200 - 239 mg/dL          High:  > = 240 mg/dL   Triglycerides 65.0 0.0 - 149.0 mg/dL    Comment: Normal:  <150 mg/dLBorderline High:  150 - 199 mg/dL   HDL 51.60 >39.00 mg/dL   VLDL 13.0 0.0 - 40.0 mg/dL   LDL Cholesterol 77 0 - 99 mg/dL   Total CHOL/HDL Ratio 3     Comment:                Men          Women1/2 Average Risk     3.4          3.3Average Risk          5.0          4.42X Average Risk          9.6          7.13X Average Risk          15.0          11.0                       NonHDL 90.43     Comment: NOTE:  Non-HDL goal should be 30 mg/dL higher than patient's LDL goal (i.e. LDL goal of < 70 mg/dL, would have non-HDL goal of < 100 mg/dL)  Vitamin D (25 hydroxy)     Status: Abnormal   Collection Time: 03/24/18 11:19 AM  Result Value Ref Range   VITD 24.19 (L) 30.00 - 100.00 ng/mL  Hepatitis B surface antibody     Status: Abnormal   Collection Time: 03/24/18 11:19 AM  Result Value Ref Range   Hepatitis B-Post <5 (L) > OR = 10 mIU/mL    Comment: . Patient does not have immunity to hepatitis B virus. . For additional  information, please refer to http://education.quest[facility].com/faq/FAQ105 (This link is being provided for informational/ educational purposes only).    Objective  Body mass index is 37.49 kg/m. Wt Readings from Last 3 Encounters:  04/28/18 205 lb (93 kg)  03/24/18 209 lb 9.6 oz (95.1 kg)  10/06/16 170 lb (77.1 kg)   Temp Readings from Last 3 Encounters:  04/28/18 98.2 F (36.8  C) (Oral)  03/24/18 98 F (36.7 C)  10/06/16 98.4 F (36.9 C)   BP Readings from Last 3 Encounters:  04/28/18 116/76  03/24/18 112/70  10/06/16 124/83   Pulse Readings from Last 3 Encounters:  04/28/18 78  03/24/18 73  10/06/16 68    Physical Exam  Constitutional: She is oriented to person, place, and time. Vital signs are normal. She appears well-developed and well-nourished. She is cooperative.  HENT:  Head: Normocephalic and atraumatic.  Mouth/Throat: Oropharynx is clear and moist and mucous membranes are normal.  Eyes: Pupils are equal, round, and reactive to light. Conjunctivae are normal.  Cardiovascular: Normal rate, regular rhythm and normal heart sounds.  Pulmonary/Chest: Effort normal and breath sounds normal.  Abdominal: Soft. Bowel sounds are normal.  Genitourinary: Vagina normal and uterus normal. Pelvic exam was performed with patient supine. Cervix exhibits no motion tenderness, no discharge and no friability. Right adnexum displays no mass, no tenderness and no fullness. Left adnexum displays no mass, no tenderness and no fullness.  Neurological: She is alert and oriented to person, place, and time. Gait normal.  Skin: Skin is warm, dry and intact.  Psychiatric: She has a normal mood and affect. Her speech is normal and behavior is normal. Judgment and thought content normal. Cognition and memory are normal.  Vitals reviewed.   Assessment   1. Annual  2. Obesity BMI 37.49  3. Subclinical hypothyroidism. She did have thyroid issues with pregnancy  4. Vaginal spotting s/p  hysterectomy 10/2010 with cervix intact  Plan   1.  Pap today  Had flu shot 9 or 08/2018  Tdap had 02/18/11  HPV given info  Given hep B 1/3 today Continue healthy diet choices and exercise  2. Refilled adipex today 2/3  3. Levo continue same dose  repeat TSH at f/u  Will order thyroid US  4. Consider ob/gyn referral in future     Provider: Dr. Olivia Mackie McLean-Scocuzza-Internal Medicine

## 2018-04-29 ENCOUNTER — Telehealth: Payer: Self-pay

## 2018-04-29 NOTE — Telephone Encounter (Signed)
Copied from [place] [phone]. Topic: Inquiry >> Apr 29, 2018 11:35 AM Margot Ables wrote: Reason for CRM: pt returning call. No notes/CRM. Per [place] she believes Juliann Pulse called pt. 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

## 2018-05-02 NOTE — Telephone Encounter (Signed)
No call initiated by me

## 2018-05-02 NOTE — Telephone Encounter (Signed)
Have you called patinet? Unsure what patient is meaning. No results or recent notes.  Pap has not came back as of yet for results.

## 2018-05-03 LAB — CYTOLOGY - PAP
DIAGNOSIS: NEGATIVE
HPV: NOT DETECTED

## 2018-05-05 ENCOUNTER — Ambulatory Visit: Payer: BC Managed Care – PPO

## 2018-05-09 ENCOUNTER — Telehealth: Payer: Self-pay

## 2018-05-09 NOTE — Telephone Encounter (Signed)
Prior authorization approved for Phentermine HCI 37.5 from 05-06-18 to 08-06-18

## 2018-05-16 ENCOUNTER — Ambulatory Visit
Admission: RE | Admit: 2018-05-16 | Discharge: 2018-05-16 | Disposition: A | Discharge status: Home / Self Care | Payer: BC Managed Care – PPO | Source: Ambulatory Visit | Attending: Internal Medicine | Admitting: Internal Medicine

## 2018-05-16 ENCOUNTER — Other Ambulatory Visit: Payer: Self-pay | Admitting: Internal Medicine

## 2018-05-16 DIAGNOSIS — E049 Nontoxic goiter, unspecified: Secondary | ICD-10-CM | POA: Insufficient documentation

## 2018-05-16 DIAGNOSIS — E039 Hypothyroidism, unspecified: Secondary | ICD-10-CM

## 2018-05-16 DIAGNOSIS — E038 Other specified hypothyroidism: Secondary | ICD-10-CM

## 2018-06-07 ENCOUNTER — Encounter: Payer: Self-pay | Admitting: Internal Medicine

## 2018-06-07 ENCOUNTER — Ambulatory Visit: Payer: BC Managed Care – PPO | Admitting: Internal Medicine

## 2018-06-07 VITALS — BP 116/72 | HR 76 | Temp 98.4°F | Ht 62.0 in | Wt 198.6 lb

## 2018-06-07 DIAGNOSIS — E669 Obesity, unspecified: Secondary | ICD-10-CM | POA: Diagnosis not present

## 2018-06-07 DIAGNOSIS — E559 Vitamin D deficiency, unspecified: Secondary | ICD-10-CM | POA: Diagnosis not present

## 2018-06-07 DIAGNOSIS — Z23 Encounter for immunization: Secondary | ICD-10-CM | POA: Diagnosis not present

## 2018-06-07 DIAGNOSIS — E039 Hypothyroidism, unspecified: Secondary | ICD-10-CM | POA: Diagnosis not present

## 2018-06-07 DIAGNOSIS — E038 Other specified hypothyroidism: Secondary | ICD-10-CM

## 2018-06-07 LAB — TSH: TSH: 5.04 u[IU]/mL — ABNORMAL HIGH (ref 0.35–4.50)

## 2018-06-07 MED ORDER — PHENTERMINE HCL 37.5 MG PO TABS: 37.5000 mg | ORAL_TABLET | Freq: Every day | ORAL | 1 refills | Status: DC

## 2018-06-07 NOTE — Patient Instructions (Addendum)
Last shot due 10/29/18 ok to come in on 10/28/18 Try Adipex x 2 more months    Hypothyroidism Hypothyroidism is a disorder of the thyroid. The thyroid is a large gland that is located in the lower front of the neck. The thyroid releases hormones that control how the body works. With hypothyroidism, the thyroid does not make enough of these hormones. What are the causes? Causes of hypothyroidism may include:  Viral infections.  Pregnancy.  Your own defense system (immune system) attacking your thyroid.  Certain medicines.  Birth defects.  Past radiation treatments to your head or neck.  Past treatment with radioactive iodine.  Past surgical removal of part or all of your thyroid.  Problems with the gland that is located in the center of your brain (pituitary).  What are the signs or symptoms? Signs and symptoms of hypothyroidism may include:  Feeling as though you have no energy (lethargy).  Inability to tolerate cold.  Weight gain that is not explained by a change in diet or exercise habits.  Dry skin.  Coarse hair.  Menstrual irregularity.  Slowing of thought processes.  Constipation.  Sadness or depression.  How is this diagnosed? Your health care provider may diagnose hypothyroidism with blood tests and ultrasound tests. How is this treated? Hypothyroidism is treated with medicine that replaces the hormones that your body does not make. After you begin treatment, it may take several weeks for symptoms to go away. Follow these instructions at home:  Take medicines only as directed by your health care provider.  If you start taking any new medicines, tell your health care provider.  Keep all follow-up visits as directed by your health care provider. This is important. As your condition improves, your dosage needs may change. You will need to have blood tests regularly so that your health care provider can watch your condition. Contact a health care provider  if:  Your symptoms do not get better with treatment.  You are taking thyroid replacement medicine and: ? You sweat excessively. ? You have tremors. ? You feel anxious. ? You lose weight rapidly. ? You cannot tolerate heat. ? You have emotional swings. ? You have diarrhea. ? You feel weak. Get help right away if:  You develop chest pain.  You develop an irregular heartbeat.  You develop a rapid heartbeat. This information is not intended to replace advice given to you by your health care provider. Make sure you discuss any questions you have with your health care provider. Document Released: 11/16/2005 Document Revised: 04/23/2016 Document Reviewed: 04/03/2014 Elsevier Interactive Patient Education  2018 [employer].

## 2018-06-07 NOTE — Progress Notes (Signed)
Pre visit review using our clinic review tool, if applicable. No additional management support is needed unless otherwise documented below in the visit note. 

## 2018-06-07 NOTE — Progress Notes (Signed)
Chief Complaint  Patient presents with  . Follow-up   F/u with 2 daughters today ages 2 and 32  1. Hypothyroidism taking brand synthyroid and at times having h/as but tolerable reviewed thyroid US no nodules and thyroid enlarged but stable size overall since 2010 and normal lymph nodes  2. Wt down overall 11 lbs eating better and on adipex and exercise goal is 150s.   Review of Systems  Constitutional: Positive for weight loss.  HENT: Negative for hearing loss.   Respiratory: Negative for shortness of breath.   Cardiovascular: Negative for chest pain.  Skin: Negative for rash.  Neurological: Positive for headaches.   Past Medical History:  Diagnosis Date  . Anemia    blood transfusion 2003  . Frequent headaches   . Kidney stone   . Subclinical hypothyroidism   . Thyroid disease   . Vaginal bleeding    Past Surgical History:  Procedure Laterality Date  . ABDOMINAL HYSTERECTOMY     Partial  . FACIAL RECONSTRUCTION SURGERY  May 2003  . LAPAROSCOPIC HYSTERECTOMY  2011   11/01/10 still has cervix    Family History  Problem Relation Age of Onset  . Diabetes Father   . Thyroid disease Mother   . Depression Mother   . Anxiety disorder Mother   . Cancer Maternal Grandmother        breast cancer  . Thyroid disease Maternal Grandmother   . Breast cancer Maternal Grandmother   . Diabetes Maternal Grandfather    Social History   Socioeconomic History  . Marital status: Married    Spouse name: Not on file  . Number of children: Not on file  . Years of education: Not on file  . Highest education level: Not on file  Occupational History  . Not on file  Social Needs  . Financial resource strain: Not on file  . Food insecurity:    Worry: Not on file    Inability: Not on file  . Transportation needs:    Medical: Not on file    Non-medical: Not on file  Tobacco Use  . Smoking status: Never Smoker  . Smokeless tobacco: Never Used  Substance and Sexual Activity  . Alcohol  use: Yes    Alcohol/week: 0.0 oz    Comment: occasionally  . Drug use: No  . Sexual activity: Not Currently  Lifestyle  . Physical activity:    Days per week: Not on file    Minutes per session: Not on file  . Stress: Not on file  Relationships  . Social connections:    Talks on phone: Not on file    Gets together: Not on file    Attends religious service: Not on file    Active member of club or organization: Not on file    Attends meetings of clubs or organizations: Not on file    Relationship status: Not on file  . Intimate partner violence:    Fear of current or ex partner: Not on file    Emotionally abused: Not on file    Physically abused: Not on file    Forced sexual activity: Not on file  Other Topics Concern  . 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 Cholecalciferol (D-3-5) 5000 units capsule Take 5,000 Units by mouth daily.  . phentermine (ADIPEX-P) 37.5 MG tablet Take 1 tablet (37.5 mg total) by mouth daily before breakfast.  . SYNTHROID 25 MCG tablet TAKE 1 TABLET (25 MCG TOTAL) BY MOUTH DAILY BEFORE BREAKFAST.   No Known Allergies Recent Results (from the past 2160 hour(s))  Comprehensive metabolic panel     Status: None   Collection Time: 03/24/18 11:19 AM  Result Value Ref Range   Sodium 141 135 - 145 mEq/L   Potassium 3.9 3.5 - 5.1 mEq/L   Chloride 106 96 - 112 mEq/L   CO2 28 19 - 32 mEq/L   Glucose, Bld 89 70 - 99 mg/dL   BUN 10 6 - 23 mg/dL   Creatinine, Ser 0.71 0.40 - 1.20 mg/dL   Total Bilirubin 0.6 0.2 - 1.2 mg/dL   Alkaline Phosphatase 102 39 - 117 U/L   AST 20 0 - 37 U/L   ALT 18 0 - 35 U/L   Total Protein 6.7 6.0 - 8.3 g/dL   Albumin 4.1 3.5 - 5.2 g/dL   Calcium 8.9 8.4 - 10.5 mg/dL   GFR 99.86 >60.00  mL/min  CBC with Differential/Platelet     Status: None   Collection Time: 03/24/18 11:19 AM  Result Value Ref Range   WBC 6.0 4.0 - 10.5 K/uL   RBC 4.67 3.87 - 5.11 Mil/uL   Hemoglobin 13.7 12.0 - 15.0 g/dL   HCT 40.4 36.0 - 46.0 %   MCV 86.5 78.0 - 100.0 fl   MCHC 33.9 30.0 - 36.0 g/dL   RDW 13.3 11.5 - 15.5 %   Platelets 230.0 150.0 - 400.0 K/uL   Neutrophils Relative % 56.8 43.0 - 77.0 %   Lymphocytes Relative 35.0 12.0 - 46.0 %   Monocytes Relative 6.7 3.0 - 12.0 %   Eosinophils Relative 1.0 0.0 - 5.0 %   Basophils Relative 0.5 0.0 - 3.0 %   Neutro Abs 3.4 1.4 - 7.7 K/uL   Lymphs Abs 2.1 0.7 - 4.0 K/uL   Monocytes Absolute 0.4 0.1 - 1.0 K/uL   Eosinophils Absolute 0.1 0.0 - 0.7 K/uL   Basophils Absolute 0.0 0.0 - 0.1 K/uL  Urinalysis, Routine w reflex microscopic     Status: None   Collection Time: 03/24/18 11:19 AM  Result Value Ref Range   Specific Gravity, UA 1.019 1.005 - 1.030   pH, UA 6.0 5.0 - 7.5   Color, UA Yellow Yellow   Appearance Ur Clear Clear   Leukocytes, UA Negative Negative   Protein, UA Negative Negative/Trace   Glucose, UA Negative Negative   Ketones, UA Negative Negative   RBC, UA Negative Negative   Bilirubin, UA Negative Negative   Urobilinogen, Ur 0.2 0.2 - 1.0 mg/dL   Nitrite, UA Negative Negative   Microscopic Examination Comment     Comment: Microscopic not indicated and not performed.  TSH     Status: Abnormal   Collection Time: 03/24/18 11:19 AM  Result Value Ref Range   TSH 6.52 (H) 0.35 - 4.50 uIU/mL  T4, free     Status: None   Collection Time: 03/24/18 11:19 AM  Result Value Ref Range   Free T4 0.71 0.60 - 1.60 ng/dL    Comment: Specimens from patients who are undergoing biotin therapy and /or ingesting biotin supplements may contain high levels of biotin.  The higher biotin  concentration in these specimens interferes with this Free T4 assay.  Specimens that contain high levels  of biotin may cause false high results for this Free  T4 assay.  Please interpret results in light of the total clinical presentation of the patient.    T3, free     Status: None   Collection Time: 03/24/18 11:19 AM  Result Value Ref Range   T3, Free 3.7 2.3 - 4.2 pg/mL  Thyroid peroxidase antibody     Status: Abnormal   Collection Time: 03/24/18 11:19 AM  Result Value Ref Range   Thyroperoxidase Ab SerPl-aCnc 170 (H) <9 IU/mL  Lipid panel     Status: None   Collection Time: 03/24/18 11:19 AM  Result Value Ref Range   Cholesterol 142 0 - 200 mg/dL    Comment: ATP III Classification       Desirable:  < 200 mg/dL               Borderline High:  200 - 239 mg/dL          High:  > = 240 mg/dL   Triglycerides 65.0 0.0 - 149.0 mg/dL    Comment: Normal:  <150 mg/dLBorderline High:  150 - 199 mg/dL   HDL 51.60 >39.00 mg/dL   VLDL 13.0 0.0 - 40.0 mg/dL   LDL Cholesterol 77 0 - 99 mg/dL   Total CHOL/HDL Ratio 3     Comment:                Men          Women1/2 Average Risk     3.4          3.3Average Risk          5.0          4.42X Average Risk          9.6          7.13X Average Risk          15.0          11.0                       NonHDL 90.43     Comment: NOTE:  Non-HDL goal should be 30 mg/dL higher than patient's LDL goal (i.e. LDL goal of < 70 mg/dL, would have non-HDL goal of < 100 mg/dL)  Vitamin D (25 hydroxy)     Status: Abnormal   Collection Time: 03/24/18 11:19 AM  Result Value Ref Range   VITD 24.19 (L) 30.00 - 100.00 ng/mL  Hepatitis B surface antibody     Status: Abnormal   Collection Time: 03/24/18 11:19 AM  Result Value Ref Range   Hepatitis B-Post <5 (L) > OR = 10 mIU/mL    Comment: . Patient does not have immunity to hepatitis B virus. . For additional information, please refer to http://education.[facility].com/faq/FAQ105 (This link is being provided for informational/ educational purposes only).   Cytology - PAP     Status: None   Collection Time: 04/28/18 12:00 AM  Result Value Ref Range   Adequacy       Satisfactory for evaluation  endocervical/transformation zone component PRESENT.   Diagnosis      NEGATIVE FOR INTRAEPITHELIAL LESIONS OR MALIGNANCY.   HPV NOT DETECTED     Comment: Normal Reference Range - NOT Detected   Material Submitted CervicoVaginal Pap [ThinPrep Imaged]    Objective  Body mass index is 36.32 kg/m. Wt Readings  from Last 3 Encounters:  06/07/18 198 lb 9.6 oz (90.1 kg)  04/28/18 205 lb (93 kg)  03/24/18 209 lb 9.6 oz (95.1 kg)   Temp Readings from Last 3 Encounters:  06/07/18 98.4 F (36.9 C) (Oral)  04/28/18 98.2 F (36.8 C) (Oral)  03/24/18 98 F (36.7 C)   BP Readings from Last 3 Encounters:  06/07/18 116/72  04/28/18 116/76  03/24/18 112/70   Pulse Readings from Last 3 Encounters:  06/07/18 76  04/28/18 78  03/24/18 73    Physical Exam  Constitutional: She is oriented to person, place, and time. Vital signs are normal. She appears well-developed and well-nourished. She is cooperative.  HENT:  Head: Normocephalic and atraumatic.  Mouth/Throat: Oropharynx is clear and moist and mucous membranes are normal.  Eyes: Pupils are equal, round, and reactive to light. Conjunctivae are normal.  Cardiovascular: Normal rate, regular rhythm and normal heart sounds.  Pulmonary/Chest: Effort normal and breath sounds normal.  Neurological: She is alert and oriented to person, place, and time. Gait normal.  Skin: Skin is warm, dry and intact.  Psychiatric: She has a normal mood and affect. Her speech is normal and behavior is normal. Judgment and thought content normal. Cognition and memory are normal.  Nursing note and vitals reviewed.   Assessment   1. Hypothyroidism  2. Obesity BMI >36  3. HM Plan  1. Check TSH today  On brand synthyroid 25 mcg qd  Consider getting [facility] [place] 2/2 cost  2. Adipex x 2 months then stop and f/u in 3 months goal 150  Cont healthy diet and exercise  3.  Pap 04/28/18 neg no HPV no further abnormal vaginal  bleeding hold on OB/GYN referral  Had flu shot 9 or 08/2018  Tdap had 02/18/11  HPV given info  Given hep B 2/3 today Continue healthy diet choices and exercise   Provider: Dr. Olivia Mackie McLean-Scocuzza-Internal Medicine

## 2018-09-08 ENCOUNTER — Encounter: Payer: Self-pay | Admitting: Internal Medicine

## 2018-09-08 ENCOUNTER — Ambulatory Visit: Payer: BC Managed Care – PPO | Admitting: Internal Medicine

## 2018-09-08 VITALS — BP 98/62 | HR 69 | Temp 98.7°F | Ht 62.0 in | Wt 190.6 lb

## 2018-09-08 DIAGNOSIS — Z0184 Encounter for antibody response examination: Secondary | ICD-10-CM

## 2018-09-08 DIAGNOSIS — R6882 Decreased libido: Secondary | ICD-10-CM | POA: Insufficient documentation

## 2018-09-08 DIAGNOSIS — E039 Hypothyroidism, unspecified: Secondary | ICD-10-CM

## 2018-09-08 DIAGNOSIS — E669 Obesity, unspecified: Secondary | ICD-10-CM | POA: Diagnosis not present

## 2018-09-08 DIAGNOSIS — E038 Other specified hypothyroidism: Secondary | ICD-10-CM

## 2018-09-08 DIAGNOSIS — Z1159 Encounter for screening for other viral diseases: Secondary | ICD-10-CM

## 2018-09-08 NOTE — Patient Instructions (Addendum)
Call me back 12/2018 and we will do adipex lower dose  Consider armour thyroid in the future

## 2018-09-08 NOTE — Progress Notes (Signed)
Pre visit review using our clinic review tool, if applicable. No additional management support is needed unless otherwise documented below in the visit note. 

## 2018-09-08 NOTE — Progress Notes (Signed)
Chief Complaint  Patient presents with  . Follow-up   F/u  1. Hypothyroidism last TSH >5 05/2018 will repeat today she is getting h/a with synthyroid 25 mcg  2. Obesity BMI 34.86 lost 20-30 #s with adipex 37.5 but high dose taking it even at 5:30 am caused lack of sleep 3. C/o low sex drive s/p hysterectomy though this is new   Review of Systems  Constitutional: Positive for weight loss.  HENT: Negative for hearing loss.   Eyes: Negative for blurred vision.  Respiratory: Negative for shortness of breath.   Cardiovascular: Negative for chest pain.  Genitourinary:       Reduced sex drive    Skin: Negative for rash.  Neurological: Positive for headaches.  Psychiatric/Behavioral: Negative for depression. The patient is not nervous/anxious.    Past Medical History:  Diagnosis Date  . Anemia    blood transfusion 2003  . Frequent headaches   . Kidney stone   . Subclinical hypothyroidism   . Thyroid disease   . Vaginal bleeding   . Vitamin D deficiency    Past Surgical History:  Procedure Laterality Date  . ABDOMINAL HYSTERECTOMY     Partial  . FACIAL RECONSTRUCTION SURGERY  May 2003  . LAPAROSCOPIC HYSTERECTOMY  2011   11/01/10 still has cervix    Family History  Problem Relation Age of Onset  . Diabetes Father   . Thyroid disease Mother   . Depression Mother   . Anxiety disorder Mother   . Cancer Maternal Grandmother        breast cancer  . Thyroid disease Maternal Grandmother   . Breast cancer Maternal Grandmother   . Diabetes Maternal Grandfather    Social History   Socioeconomic History  . Marital status: Married    Spouse name: Not on file  . Number of children: Not on file  . Years of education: Not on file  . Highest education level: Not on file  Occupational History  . Not on file  Social Needs  . Financial resource strain: Not on file  . Food insecurity:    Worry: Not on file    Inability: Not on file  . Transportation needs:    Medical: Not on  file    Non-medical: Not on file  Tobacco Use  . Smoking status: Never Smoker  . Smokeless tobacco: Never Used  Substance and Sexual Activity  . Alcohol use: Yes    Alcohol/week: 0.0 standard drinks    Comment: occasionally  . Drug use: No  . Sexual activity: Not Currently  Lifestyle  . Physical activity:    Days per week: Not on file    Minutes per session: Not on file  . Stress: Not on file  Relationships  . Social connections:    Talks on phone: Not on file    Gets together: Not on file    Attends religious service: Not on file    Active member of club or organization: Not on file    Attends meetings of clubs or organizations: Not on file    Relationship status: Not on file  . Intimate partner violence:    Fear of current or ex partner: Not on file    Emotionally abused: Not on file    Physically abused: Not on file    Forced sexual activity: Not on file  Other Topics Concern  . 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 Cholecalciferol (D-3-5) 5000 units capsule Take 5,000 Units by mouth daily.  . phentermine (ADIPEX-P) 37.5 MG tablet Take 1 tablet (37.5 mg total) by mouth daily before breakfast.  . SYNTHROID 25 MCG tablet TAKE 1 TABLET (25 MCG TOTAL) BY MOUTH DAILY BEFORE BREAKFAST.   No Known Allergies No results found for this or any previous visit (from the past 2160 hour(s)). Objective  Body mass index is 34.86 kg/m. Wt Readings from Last 3 Encounters:  09/08/18 190 lb 9.6 oz (86.5 kg)  06/07/18 198 lb 9.6 oz (90.1 kg)  04/28/18 205 lb (93 kg)   Temp Readings from Last 3 Encounters:  09/08/18 98.7 F (37.1 C) (Oral)  06/07/18 98.4 F (36.9 C) (Oral)  04/28/18 98.2 F (36.8 C) (Oral)   BP Readings from Last 3 Encounters:   09/08/18 98/62  06/07/18 116/72  04/28/18 116/76   Pulse Readings from Last 3 Encounters:  09/08/18 69  06/07/18 76  04/28/18 78    Physical Exam  Constitutional: She is oriented to person, place, and time. Vital signs are normal. She appears well-developed and well-nourished. She is cooperative.  HENT:  Head: Normocephalic and atraumatic.  Mouth/Throat: Oropharynx is clear and moist and mucous membranes are normal.  Eyes: Pupils are equal, round, and reactive to light. Conjunctivae are normal.  Cardiovascular: Normal rate, regular rhythm and normal heart sounds.  Pulmonary/Chest: Effort normal and breath sounds normal.  Neurological: She is alert and oriented to person, place, and time. Gait normal.  Skin: Skin is warm, dry and intact.  Psychiatric: She has a normal mood and affect. Her speech is normal and behavior is normal. Judgment and thought content normal. Cognition and memory are normal.  Nursing note and vitals reviewed.   Assessment   1. hypothyriodism with elevated thyroid antibodies/thyroiditis  05/16/18 thyroid US goiter heterogeneous no nodules   2. Obesity BMI 34.86 lost 20-30 lbs  3. Reduced libido  4. HM Plan   1.  Pt wants to try another thyroid formulation disc armour thyroid or tirosint disc with pharmacist if armour thyroid dose is less than synthyroid see conversion chart  Currently on synthyroid 25 mcg dose armour thyroid if normal tsh would be 15 mg daily conversion reviewed conversion chart [email or web address]-guide  Check TSH today  2. Cont healthy diet choices and exercise  Call back 12/2018 to get phentermine 15 mg will do again x 4 more months  F/u 03/2019  3. Consider sex therapy vs see ob/gyn disc estrogen therapy if related post surgical menopause declines due to side effects  4.  Pap 04/28/18 neg no HPV no further abnormal vaginal bleeding hold on OB/GYN referral, s/p parital hysterectomy  Mammogram age 37 yo   Flu shot  declines Tdap had 02/18/11  HPV given info  Given hep B 2/3 3rd due 09/2018  Continue healthy diet choices and exercise congratulated on wt loss    Provider: Dr. Olivia Mackie McLean-Scocuzza-Internal Medicine

## 2018-09-12 LAB — MEASLES/MUMPS/RUBELLA IMMUNITY: Rubeola IgG: <25 AU/mL — ABNORMAL LOW

## 2018-09-12 LAB — TSH: TSH: 2.35 m[IU]/L

## 2018-09-15 ENCOUNTER — Other Ambulatory Visit: Payer: Self-pay | Admitting: Internal Medicine

## 2018-09-15 DIAGNOSIS — E039 Hypothyroidism, unspecified: Secondary | ICD-10-CM

## 2018-09-15 MED ORDER — THYROID 15 MG PO TABS: 15.0000 mg | ORAL_TABLET | Freq: Every day | ORAL | 1 refills | Status: DC

## 2018-10-26 ENCOUNTER — Ambulatory Visit (INDEPENDENT_AMBULATORY_CARE_PROVIDER_SITE_OTHER): Payer: BC Managed Care – PPO

## 2018-10-26 DIAGNOSIS — Z23 Encounter for immunization: Secondary | ICD-10-CM

## 2018-10-26 NOTE — Progress Notes (Signed)
Patient presented for Hep B injection to left deltoid, patient voiced no concerns nor showed any signs of distress during injection. 

## 2018-10-26 NOTE — Progress Notes (Signed)
Agree   TMS 

## 2018-11-22 ENCOUNTER — Ambulatory Visit: Payer: BC Managed Care – PPO

## 2018-12-16 ENCOUNTER — Ambulatory Visit (INDEPENDENT_AMBULATORY_CARE_PROVIDER_SITE_OTHER): Payer: Self-pay | Admitting: Internal Medicine

## 2018-12-16 ENCOUNTER — Encounter: Payer: Self-pay | Admitting: Internal Medicine

## 2018-12-16 ENCOUNTER — Ambulatory Visit: Payer: BC Managed Care – PPO | Admitting: Internal Medicine

## 2018-12-16 VITALS — BP 118/70 | HR 70 | Temp 98.1°F | Ht 62.0 in | Wt 191.2 lb

## 2018-12-16 DIAGNOSIS — Z0184 Encounter for antibody response examination: Secondary | ICD-10-CM

## 2018-12-16 DIAGNOSIS — E039 Hypothyroidism, unspecified: Secondary | ICD-10-CM

## 2018-12-16 DIAGNOSIS — Z0000 Encounter for general adult medical examination without abnormal findings: Secondary | ICD-10-CM

## 2018-12-16 DIAGNOSIS — J0100 Acute maxillary sinusitis, unspecified: Secondary | ICD-10-CM

## 2018-12-16 DIAGNOSIS — E669 Obesity, unspecified: Secondary | ICD-10-CM

## 2018-12-16 DIAGNOSIS — Z1159 Encounter for screening for other viral diseases: Secondary | ICD-10-CM

## 2018-12-16 DIAGNOSIS — E559 Vitamin D deficiency, unspecified: Secondary | ICD-10-CM

## 2018-12-16 DIAGNOSIS — J329 Chronic sinusitis, unspecified: Secondary | ICD-10-CM

## 2018-12-16 MED ORDER — FLUCONAZOLE 150 MG PO TABS: 150.0000 mg | ORAL_TABLET | Freq: Once | ORAL | 0 refills | Status: AC

## 2018-12-16 MED ORDER — THYROID 15 MG PO TABS: 15.0000 mg | ORAL_TABLET | Freq: Every day | ORAL | 1 refills | Status: DC

## 2018-12-16 MED ORDER — AZITHROMYCIN 250 MG PO TABS: ORAL_TABLET | ORAL | 0 refills | Status: DC

## 2018-12-16 NOTE — Progress Notes (Signed)
Chief Complaint  Patient presents with  . Follow-up   F/u  1. Sinusitis maxillary since Xray tried Mucinex for cough and helped still having sinus pressure  2. Hypothyroidism needs refill or armour thyroid doing well 15 mg qd and tolerating  3. Obesity wants to resume adipex currently w/o insurance will call back when wants it    Review of Systems  Constitutional: Negative for weight loss.  HENT: Positive for sinus pain. Negative for hearing loss.   Eyes: Negative for blurred vision.  Respiratory: Negative for cough and shortness of breath.   Cardiovascular: Negative for chest pain.  Gastrointestinal: Negative for abdominal pain.  Skin: Negative for rash.  Neurological: Negative for headaches.  Psychiatric/Behavioral: Negative for depression.   Past Medical History:  Diagnosis Date  . Anemia    blood transfusion 2003  . Frequent headaches   . Kidney stone   . Subclinical hypothyroidism   . Thyroid disease   . Vaginal bleeding   . Vitamin D deficiency    Past Surgical History:  Procedure Laterality Date  . ABDOMINAL HYSTERECTOMY     Partial  . FACIAL RECONSTRUCTION SURGERY  May 2003  . LAPAROSCOPIC HYSTERECTOMY  2011   11/01/10 still has cervix    Family History  Problem Relation Age of Onset  . Diabetes Father   . Thyroid disease Mother   . Depression Mother   . Anxiety disorder Mother   . Cancer Maternal Grandmother        breast cancer  . Thyroid disease Maternal Grandmother   . Breast cancer Maternal Grandmother   . Diabetes Maternal Grandfather    Social History   Socioeconomic History  . Marital status: Married    Spouse name: Not on file  . Number of children: Not on file  . Years of education: Not on file  . Highest education level: Not on file  Occupational History  . Not on file  Social Needs  . Financial resource strain: Not on file  . Food insecurity:    Worry: Not on file    Inability: Not on file  . Transportation needs:    Medical: Not  on file    Non-medical: Not on file  Tobacco Use  . Smoking status: Never Smoker  . Smokeless tobacco: Never Used  Substance and Sexual Activity  . Alcohol use: Yes    Alcohol/week: 0.0 standard drinks    Comment: occasionally  . Drug use: No  . Sexual activity: Not Currently  Lifestyle  . Physical activity:    Days per week: Not on file    Minutes per session: Not on file  . Stress: Not on file  Relationships  . Social connections:    Talks on phone: Not on file    Gets together: Not on file    Attends religious service: Not on file    Active member of club or organization: Not on file    Attends meetings of clubs or organizations: Not on file    Relationship status: Not on file  . Intimate partner violence:    Fear of current or ex partner: Not on file    Emotionally abused: Not on file    Physically abused: Not on file    Forced sexual activity: Not on file  Other Topics Concern  . 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 Cholecalciferol (D-3-5) 5000 units capsule Take 5,000 Units by mouth daily.  Marland Kitchen thyroid (ARMOUR THYROID) 15 MG tablet Take 1 tablet (15 mg total) by mouth daily.   No Known Allergies No results found for this or any previous visit (from the past 2160 hour(s)). Objective  Body mass index is 34.97 kg/m. Wt Readings from Last 3 Encounters:  12/16/18 191 lb 3.2 oz (86.7 kg)  09/08/18 190 lb 9.6 oz (86.5 kg)  06/07/18 198 lb 9.6 oz (90.1 kg)   Temp Readings from Last 3 Encounters:  12/16/18 98.1 F (36.7 C) (Oral)  09/08/18 98.7 F (37.1 C) (Oral)  06/07/18 98.4 F (36.9 C) (Oral)   BP Readings from Last 3 Encounters:  12/16/18 118/70  09/08/18 98/62  06/07/18 116/72   Pulse Readings from Last 3 Encounters:   12/16/18 70  09/08/18 69  06/07/18 76    Physical Exam Vitals signs and nursing note reviewed.  Constitutional:      Appearance: Normal appearance. She is well-developed. She is obese.  HENT:     Head: Normocephalic and atraumatic.     Nose: Nose normal.     Mouth/Throat:     Mouth: Mucous membranes are moist.     Pharynx: Oropharynx is clear.  Eyes:     Conjunctiva/sclera: Conjunctivae normal.     Pupils: Pupils are equal, round, and reactive to light.  Cardiovascular:     Rate and Rhythm: Normal rate and regular rhythm.     Heart sounds: Normal heart sounds.  Pulmonary:     Effort: Pulmonary effort is normal.     Breath sounds: Normal breath sounds.  Skin:    General: Skin is warm and dry.  Neurological:     General: No focal deficit present.     Mental Status: She is alert and oriented to person, place, and time. Mental status is at baseline.     Gait: Gait normal.  Psychiatric:        Attention and Perception: Attention and perception normal.        Mood and Affect: Mood and affect normal.        Speech: Speech normal.        Behavior: Behavior normal. Behavior is cooperative.        Thought Content: Thought content normal.        Cognition and Memory: Cognition and memory normal.        Judgment: Judgment normal.     Assessment   1. Sinusitis with ETD 2. Hypothyroidism  3. Obesity BMI 34.97 4. HM Plan   1. zpack and diflucan  Cant tolerate nose spray using otc allergy pill 2. Refilled thyroid medication  Check labs 01/2019  3.  Call back for adipex  4.  Pap 04/28/18 neg no HPV no further abnormal vaginal bleeding hold on OB/GYN referral, s/p parital hysterectomy  Mammogram age 38 yo   Flu shot declines Tdap had 02/18/11  HPV given info  Given hep B3/3 Continue healthy diet choices and exercisecongratulated on wt loss   Provider: Dr. Olivia Mackie McLean-Scocuzza-Internal Medicine

## 2018-12-16 NOTE — Progress Notes (Signed)
Pre visit review using our clinic review tool, if applicable. No additional management support is needed unless otherwise documented below in the visit note. 

## 2018-12-16 NOTE — Patient Instructions (Addendum)
Call back when ready for phentermine or adipex   Sinusitis, Adult Sinusitis is inflammation of your sinuses. Sinuses are hollow spaces in the bones around your face. Your sinuses are located:  Around your eyes.  In the middle of your forehead.  Behind your nose.  In your cheekbones. Mucus normally drains out of your sinuses. When your nasal tissues become inflamed or swollen, mucus can become trapped or blocked. This allows bacteria, viruses, and fungi to grow, which leads to infection. Most infections of the sinuses are caused by a virus. Sinusitis can develop quickly. It can last for up to 4 weeks (acute) or for more than 12 weeks (chronic). Sinusitis often develops after a cold. What are the causes? This condition is caused by anything that creates swelling in the sinuses or stops mucus from draining. This includes:  Allergies.  Asthma.  Infection from bacteria or viruses.  Deformities or blockages in your nose or sinuses.  Abnormal growths in the nose (nasal polyps).  Pollutants, such as chemicals or irritants in the air.  Infection from fungi (rare). What increases the risk? You are more likely to develop this condition if you:  Have a weak body defense system (immune system).  Do a lot of swimming or diving.  Overuse nasal sprays.  Smoke. What are the signs or symptoms? The main symptoms of this condition are pain and a feeling of pressure around the affected sinuses. Other symptoms include:  Stuffy nose or congestion.  Thick drainage from your nose.  Swelling and warmth over the affected sinuses.  Headache.  Upper toothache.  A cough that may get worse at night.  Extra mucus that collects in the throat or the back of the nose (postnasal drip).  Decreased sense of smell and taste.  Fatigue.  A fever.  Sore throat.  Bad breath. How is this diagnosed? This condition is diagnosed based on:  Your symptoms.  Your medical history.  A physical  exam.  Tests to find out if your condition is acute or chronic. This may include: ? Checking your nose for nasal polyps. ? Viewing your sinuses using a device that has a light (endoscope). ? Testing for allergies or bacteria. ? Imaging tests, such as an MRI or CT scan. In rare cases, a bone biopsy may be done to rule out more serious types of fungal sinus disease. How is this treated? Treatment for sinusitis depends on the cause and whether your condition is chronic or acute.  If caused by a virus, your symptoms should go away on their own within 10 days. You may be given medicines to relieve symptoms. They include: ? Medicines that shrink swollen nasal passages (topical intranasal decongestants). ? Medicines that treat allergies (antihistamines). ? A spray that eases inflammation of the nostrils (topical intranasal corticosteroids). ? Rinses that help get rid of thick mucus in your nose (nasal saline washes).  If caused by bacteria, your health care provider may recommend waiting to see if your symptoms improve. Most bacterial infections will get better without antibiotic medicine. You may be given antibiotics if you have: ? A severe infection. ? A weak immune system.  If caused by narrow nasal passages or nasal polyps, you may need to have surgery. Follow these instructions at home: Medicines  Take, use, or apply over-the-counter and prescription medicines only as told by your health care provider. These may include nasal sprays.  If you were prescribed an antibiotic medicine, take it as told by your health care provider.  Do not stop taking the antibiotic even if you start to feel better. Hydrate and humidify   Drink enough fluid to keep your urine pale yellow. Staying hydrated will help to thin your mucus.  Use a cool mist humidifier to keep the humidity level in your home above 50%.  Inhale steam for 10-15 minutes, 3-4 times a day, or as told by your health care provider. You  can do this in the bathroom while a hot shower is running.  Limit your exposure to cool or dry air. Rest  Rest as much as possible.  Sleep with your head raised (elevated).  Make sure you get enough sleep each night. General instructions   Apply a warm, moist washcloth to your face 3-4 times a day or as told by your health care provider. This will help with discomfort.  Wash your hands often with soap and water to reduce your exposure to germs. If soap and water are not available, use hand sanitizer.  Do not smoke. Avoid being around people who are smoking (secondhand smoke).  Keep all follow-up visits as told by your health care provider. This is important. Contact a health care provider if:  You have a fever.  Your symptoms get worse.  Your symptoms do not improve within 10 days. Get help right away if:  You have a severe headache.  You have persistent vomiting.  You have severe pain or swelling around your face or eyes.  You have vision problems.  You develop confusion.  Your neck is stiff.  You have trouble breathing. Summary  Sinusitis is soreness and inflammation of your sinuses. Sinuses are hollow spaces in the bones around your face.  This condition is caused by nasal tissues that become inflamed or swollen. The swelling traps or blocks the flow of mucus. This allows bacteria, viruses, and fungi to grow, which leads to infection.  If you were prescribed an antibiotic medicine, take it as told by your health care provider. Do not stop taking the antibiotic even if you start to feel better.  Keep all follow-up visits as told by your health care provider. This is important. This information is not intended to replace advice given to you by your health care provider. Make sure you discuss any questions you have with your health care provider. Document Released: 11/16/2005 Document Revised: 04/18/2018 Document Reviewed: 04/18/2018 Elsevier Interactive Patient  Education  2019 [employer].

## 2019-01-26 ENCOUNTER — Other Ambulatory Visit: Payer: Self-pay | Admitting: Internal Medicine

## 2019-01-26 DIAGNOSIS — E669 Obesity, unspecified: Secondary | ICD-10-CM

## 2019-01-26 MED ORDER — PHENTERMINE HCL 37.5 MG PO TABS: 37.5000 mg | ORAL_TABLET | Freq: Every day | ORAL | 0 refills | Status: DC

## 2019-02-02 DIAGNOSIS — M9903 Segmental and somatic dysfunction of lumbar region: Secondary | ICD-10-CM | POA: Diagnosis not present

## 2019-02-02 DIAGNOSIS — R51 Headache: Secondary | ICD-10-CM | POA: Diagnosis not present

## 2019-02-02 DIAGNOSIS — M9901 Segmental and somatic dysfunction of cervical region: Secondary | ICD-10-CM | POA: Diagnosis not present

## 2019-02-02 DIAGNOSIS — M5416 Radiculopathy, lumbar region: Secondary | ICD-10-CM | POA: Diagnosis not present

## 2019-02-03 DIAGNOSIS — M9903 Segmental and somatic dysfunction of lumbar region: Secondary | ICD-10-CM | POA: Diagnosis not present

## 2019-02-03 DIAGNOSIS — R51 Headache: Secondary | ICD-10-CM | POA: Diagnosis not present

## 2019-02-03 DIAGNOSIS — M5416 Radiculopathy, lumbar region: Secondary | ICD-10-CM | POA: Diagnosis not present

## 2019-02-03 DIAGNOSIS — M9901 Segmental and somatic dysfunction of cervical region: Secondary | ICD-10-CM | POA: Diagnosis not present

## 2019-02-06 DIAGNOSIS — M5416 Radiculopathy, lumbar region: Secondary | ICD-10-CM | POA: Diagnosis not present

## 2019-02-06 DIAGNOSIS — M9903 Segmental and somatic dysfunction of lumbar region: Secondary | ICD-10-CM | POA: Diagnosis not present

## 2019-02-06 DIAGNOSIS — R51 Headache: Secondary | ICD-10-CM | POA: Diagnosis not present

## 2019-02-06 DIAGNOSIS — M9901 Segmental and somatic dysfunction of cervical region: Secondary | ICD-10-CM | POA: Diagnosis not present

## 2019-02-08 DIAGNOSIS — M9903 Segmental and somatic dysfunction of lumbar region: Secondary | ICD-10-CM | POA: Diagnosis not present

## 2019-02-08 DIAGNOSIS — R51 Headache: Secondary | ICD-10-CM | POA: Diagnosis not present

## 2019-02-08 DIAGNOSIS — M5416 Radiculopathy, lumbar region: Secondary | ICD-10-CM | POA: Diagnosis not present

## 2019-02-08 DIAGNOSIS — M9901 Segmental and somatic dysfunction of cervical region: Secondary | ICD-10-CM | POA: Diagnosis not present

## 2019-02-10 DIAGNOSIS — R51 Headache: Secondary | ICD-10-CM | POA: Diagnosis not present

## 2019-02-10 DIAGNOSIS — M5416 Radiculopathy, lumbar region: Secondary | ICD-10-CM | POA: Diagnosis not present

## 2019-02-10 DIAGNOSIS — M9901 Segmental and somatic dysfunction of cervical region: Secondary | ICD-10-CM | POA: Diagnosis not present

## 2019-02-10 DIAGNOSIS — M9903 Segmental and somatic dysfunction of lumbar region: Secondary | ICD-10-CM | POA: Diagnosis not present

## 2019-02-13 DIAGNOSIS — M9901 Segmental and somatic dysfunction of cervical region: Secondary | ICD-10-CM | POA: Diagnosis not present

## 2019-02-13 DIAGNOSIS — M5416 Radiculopathy, lumbar region: Secondary | ICD-10-CM | POA: Diagnosis not present

## 2019-02-13 DIAGNOSIS — R51 Headache: Secondary | ICD-10-CM | POA: Diagnosis not present

## 2019-02-13 DIAGNOSIS — M9903 Segmental and somatic dysfunction of lumbar region: Secondary | ICD-10-CM | POA: Diagnosis not present

## 2019-02-15 DIAGNOSIS — R51 Headache: Secondary | ICD-10-CM | POA: Diagnosis not present

## 2019-02-15 DIAGNOSIS — M9901 Segmental and somatic dysfunction of cervical region: Secondary | ICD-10-CM | POA: Diagnosis not present

## 2019-02-15 DIAGNOSIS — M9903 Segmental and somatic dysfunction of lumbar region: Secondary | ICD-10-CM | POA: Diagnosis not present

## 2019-02-15 DIAGNOSIS — M5416 Radiculopathy, lumbar region: Secondary | ICD-10-CM | POA: Diagnosis not present

## 2019-02-17 ENCOUNTER — Other Ambulatory Visit: Payer: Self-pay

## 2019-02-17 ENCOUNTER — Other Ambulatory Visit (INDEPENDENT_AMBULATORY_CARE_PROVIDER_SITE_OTHER): Payer: BLUE CROSS/BLUE SHIELD

## 2019-02-17 DIAGNOSIS — Z0184 Encounter for antibody response examination: Secondary | ICD-10-CM

## 2019-02-17 DIAGNOSIS — R51 Headache: Secondary | ICD-10-CM | POA: Diagnosis not present

## 2019-02-17 DIAGNOSIS — Z0000 Encounter for general adult medical examination without abnormal findings: Secondary | ICD-10-CM

## 2019-02-17 DIAGNOSIS — E039 Hypothyroidism, unspecified: Secondary | ICD-10-CM

## 2019-02-17 DIAGNOSIS — Z1159 Encounter for screening for other viral diseases: Secondary | ICD-10-CM

## 2019-02-17 DIAGNOSIS — E559 Vitamin D deficiency, unspecified: Secondary | ICD-10-CM

## 2019-02-17 DIAGNOSIS — M9901 Segmental and somatic dysfunction of cervical region: Secondary | ICD-10-CM | POA: Diagnosis not present

## 2019-02-17 DIAGNOSIS — M9903 Segmental and somatic dysfunction of lumbar region: Secondary | ICD-10-CM | POA: Diagnosis not present

## 2019-02-17 DIAGNOSIS — M5416 Radiculopathy, lumbar region: Secondary | ICD-10-CM | POA: Diagnosis not present

## 2019-02-17 LAB — CBC WITH DIFFERENTIAL/PLATELET
Basophils Absolute: 0 10*3/uL (ref 0.0–0.1)
Basophils Relative: 0.5 % (ref 0.0–3.0)
Eosinophils Absolute: 0 10*3/uL (ref 0.0–0.7)
Eosinophils Relative: 0.4 % (ref 0.0–5.0)
HCT: 40.2 % (ref 36.0–46.0)
Hemoglobin: 13.7 g/dL (ref 12.0–15.0)
Lymphocytes Relative: 34.3 % (ref 12.0–46.0)
Lymphs Abs: 2.4 10*3/uL (ref 0.7–4.0)
MCHC: 34.1 g/dL (ref 30.0–36.0)
MCV: 87.7 fl (ref 78.0–100.0)
Monocytes Absolute: 0.3 10*3/uL (ref 0.1–1.0)
Monocytes Relative: 5 % (ref 3.0–12.0)
Neutro Abs: 4.2 10*3/uL (ref 1.4–7.7)
Neutrophils Relative %: 59.8 % (ref 43.0–77.0)
Platelets: 227 10*3/uL (ref 150.0–400.0)
RBC: 4.59 Mil/uL (ref 3.87–5.11)
RDW: 12.7 % (ref 11.5–15.5)
WBC: 7 10*3/uL (ref 4.0–10.5)

## 2019-02-17 LAB — COMPREHENSIVE METABOLIC PANEL
ALT: 13 U/L (ref 0–35)
AST: 13 U/L (ref 0–37)
Albumin: 4.5 g/dL (ref 3.5–5.2)
Alkaline Phosphatase: 80 U/L (ref 39–117)
BUN: 18 mg/dL (ref 6–23)
CO2: 25 mEq/L (ref 19–32)
Calcium: 9.5 mg/dL (ref 8.4–10.5)
Chloride: 103 mEq/L (ref 96–112)
Creatinine, Ser: 0.85 mg/dL (ref 0.40–1.20)
GFR: 75.94 mL/min (ref >60.00)
Glucose, Bld: 78 mg/dL (ref 70–99)
Potassium: 3.7 mEq/L (ref 3.5–5.1)
Sodium: 139 mEq/L (ref 135–145)
Total Bilirubin: 0.6 mg/dL (ref 0.2–1.2)
Total Protein: 6.7 g/dL (ref 6.0–8.3)

## 2019-02-17 LAB — TSH: TSH: 3.73 u[IU]/mL (ref 0.35–4.50)

## 2019-02-17 LAB — VITAMIN D 25 HYDROXY (VIT D DEFICIENCY, FRACTURES): VITD: 32.09 ng/mL (ref 30.00–100.00)

## 2019-02-20 LAB — HEPATITIS B SURFACE ANTIBODY, QUANTITATIVE: HEPATITIS B-POST: 20 m[IU]/mL (ref >=10)

## 2019-02-22 ENCOUNTER — Telehealth: Payer: Self-pay | Admitting: Internal Medicine

## 2019-02-22 DIAGNOSIS — E039 Hypothyroidism, unspecified: Secondary | ICD-10-CM

## 2019-02-22 MED ORDER — THYROID 15 MG PO TABS: 15.0000 mg | ORAL_TABLET | Freq: Every day | ORAL | 1 refills | Status: DC

## 2019-02-22 NOTE — Telephone Encounter (Signed)
Pt request refill for  Thyroid (Armour Thyroid) 15mg   [id number] [place], [place]  [address]

## 2019-02-22 NOTE — Addendum Note (Signed)
Addended by: Elpidio Galea T on: 02/22/2019 02:01 PM   Modules accepted: Orders

## 2019-02-27 DIAGNOSIS — R51 Headache: Secondary | ICD-10-CM | POA: Diagnosis not present

## 2019-02-27 DIAGNOSIS — M5416 Radiculopathy, lumbar region: Secondary | ICD-10-CM | POA: Diagnosis not present

## 2019-02-27 DIAGNOSIS — M9903 Segmental and somatic dysfunction of lumbar region: Secondary | ICD-10-CM | POA: Diagnosis not present

## 2019-02-27 DIAGNOSIS — M9901 Segmental and somatic dysfunction of cervical region: Secondary | ICD-10-CM | POA: Diagnosis not present

## 2019-03-01 DIAGNOSIS — R51 Headache: Secondary | ICD-10-CM | POA: Diagnosis not present

## 2019-03-01 DIAGNOSIS — M5416 Radiculopathy, lumbar region: Secondary | ICD-10-CM | POA: Diagnosis not present

## 2019-03-01 DIAGNOSIS — M9901 Segmental and somatic dysfunction of cervical region: Secondary | ICD-10-CM | POA: Diagnosis not present

## 2019-03-01 DIAGNOSIS — M9903 Segmental and somatic dysfunction of lumbar region: Secondary | ICD-10-CM | POA: Diagnosis not present

## 2019-03-07 DIAGNOSIS — M9903 Segmental and somatic dysfunction of lumbar region: Secondary | ICD-10-CM | POA: Diagnosis not present

## 2019-03-07 DIAGNOSIS — M9901 Segmental and somatic dysfunction of cervical region: Secondary | ICD-10-CM | POA: Diagnosis not present

## 2019-03-07 DIAGNOSIS — M5416 Radiculopathy, lumbar region: Secondary | ICD-10-CM | POA: Diagnosis not present

## 2019-03-07 DIAGNOSIS — R51 Headache: Secondary | ICD-10-CM | POA: Diagnosis not present

## 2019-03-08 DIAGNOSIS — M9901 Segmental and somatic dysfunction of cervical region: Secondary | ICD-10-CM | POA: Diagnosis not present

## 2019-03-08 DIAGNOSIS — M5416 Radiculopathy, lumbar region: Secondary | ICD-10-CM | POA: Diagnosis not present

## 2019-03-08 DIAGNOSIS — M9903 Segmental and somatic dysfunction of lumbar region: Secondary | ICD-10-CM | POA: Diagnosis not present

## 2019-03-08 DIAGNOSIS — R51 Headache: Secondary | ICD-10-CM | POA: Diagnosis not present

## 2019-03-13 DIAGNOSIS — R51 Headache: Secondary | ICD-10-CM | POA: Diagnosis not present

## 2019-03-13 DIAGNOSIS — M5416 Radiculopathy, lumbar region: Secondary | ICD-10-CM | POA: Diagnosis not present

## 2019-03-13 DIAGNOSIS — M9901 Segmental and somatic dysfunction of cervical region: Secondary | ICD-10-CM | POA: Diagnosis not present

## 2019-03-13 DIAGNOSIS — M9903 Segmental and somatic dysfunction of lumbar region: Secondary | ICD-10-CM | POA: Diagnosis not present

## 2019-03-15 DIAGNOSIS — M5416 Radiculopathy, lumbar region: Secondary | ICD-10-CM | POA: Diagnosis not present

## 2019-03-15 DIAGNOSIS — M9903 Segmental and somatic dysfunction of lumbar region: Secondary | ICD-10-CM | POA: Diagnosis not present

## 2019-03-15 DIAGNOSIS — M9901 Segmental and somatic dysfunction of cervical region: Secondary | ICD-10-CM | POA: Diagnosis not present

## 2019-03-15 DIAGNOSIS — R51 Headache: Secondary | ICD-10-CM | POA: Diagnosis not present

## 2019-03-22 DIAGNOSIS — M9901 Segmental and somatic dysfunction of cervical region: Secondary | ICD-10-CM | POA: Diagnosis not present

## 2019-03-22 DIAGNOSIS — M5416 Radiculopathy, lumbar region: Secondary | ICD-10-CM | POA: Diagnosis not present

## 2019-03-22 DIAGNOSIS — R51 Headache: Secondary | ICD-10-CM | POA: Diagnosis not present

## 2019-03-22 DIAGNOSIS — M9903 Segmental and somatic dysfunction of lumbar region: Secondary | ICD-10-CM | POA: Diagnosis not present

## 2019-03-29 DIAGNOSIS — M9903 Segmental and somatic dysfunction of lumbar region: Secondary | ICD-10-CM | POA: Diagnosis not present

## 2019-03-29 DIAGNOSIS — M5416 Radiculopathy, lumbar region: Secondary | ICD-10-CM | POA: Diagnosis not present

## 2019-03-29 DIAGNOSIS — M9901 Segmental and somatic dysfunction of cervical region: Secondary | ICD-10-CM | POA: Diagnosis not present

## 2019-03-29 DIAGNOSIS — R51 Headache: Secondary | ICD-10-CM | POA: Diagnosis not present

## 2019-04-17 DIAGNOSIS — M9903 Segmental and somatic dysfunction of lumbar region: Secondary | ICD-10-CM | POA: Diagnosis not present

## 2019-04-17 DIAGNOSIS — R51 Headache: Secondary | ICD-10-CM | POA: Diagnosis not present

## 2019-04-17 DIAGNOSIS — M9901 Segmental and somatic dysfunction of cervical region: Secondary | ICD-10-CM | POA: Diagnosis not present

## 2019-04-17 DIAGNOSIS — M5416 Radiculopathy, lumbar region: Secondary | ICD-10-CM | POA: Diagnosis not present

## 2019-05-15 DIAGNOSIS — M9903 Segmental and somatic dysfunction of lumbar region: Secondary | ICD-10-CM | POA: Diagnosis not present

## 2019-05-15 DIAGNOSIS — M5416 Radiculopathy, lumbar region: Secondary | ICD-10-CM | POA: Diagnosis not present

## 2019-05-15 DIAGNOSIS — R51 Headache: Secondary | ICD-10-CM | POA: Diagnosis not present

## 2019-05-15 DIAGNOSIS — M9901 Segmental and somatic dysfunction of cervical region: Secondary | ICD-10-CM | POA: Diagnosis not present

## 2019-06-12 DIAGNOSIS — R51 Headache: Secondary | ICD-10-CM | POA: Diagnosis not present

## 2019-06-12 DIAGNOSIS — M9901 Segmental and somatic dysfunction of cervical region: Secondary | ICD-10-CM | POA: Diagnosis not present

## 2019-06-12 DIAGNOSIS — M5416 Radiculopathy, lumbar region: Secondary | ICD-10-CM | POA: Diagnosis not present

## 2019-06-12 DIAGNOSIS — M9903 Segmental and somatic dysfunction of lumbar region: Secondary | ICD-10-CM | POA: Diagnosis not present

## 2019-06-21 ENCOUNTER — Other Ambulatory Visit: Payer: Self-pay

## 2019-06-21 ENCOUNTER — Ambulatory Visit (INDEPENDENT_AMBULATORY_CARE_PROVIDER_SITE_OTHER): Payer: BC Managed Care – PPO | Admitting: Internal Medicine

## 2019-06-21 DIAGNOSIS — E669 Obesity, unspecified: Secondary | ICD-10-CM

## 2019-06-21 DIAGNOSIS — E039 Hypothyroidism, unspecified: Secondary | ICD-10-CM

## 2019-06-21 DIAGNOSIS — Z13220 Encounter for screening for lipoid disorders: Secondary | ICD-10-CM

## 2019-06-21 DIAGNOSIS — Z0000 Encounter for general adult medical examination without abnormal findings: Secondary | ICD-10-CM

## 2019-06-21 DIAGNOSIS — E038 Other specified hypothyroidism: Secondary | ICD-10-CM

## 2019-06-21 MED ORDER — PHENTERMINE HCL 37.5 MG PO TABS: 37.5000 mg | ORAL_TABLET | Freq: Every day | ORAL | 0 refills | Status: DC

## 2019-06-21 NOTE — Progress Notes (Signed)
Virtual Visit via Video Note  I connected with Bethany Mitchell   on 06/21/19 at  3:05 PM EDT by a video enabled telemedicine application and verified that I am speaking with the correct person using two identifiers.  Location patient: home Location provider:work  Persons participating in the virtual visit: patient, provider  I discussed the limitations of evaluation and management by telemedicine and the availability of in person appointments. The patient expressed understanding and agreed to proceed.   HPI: 1. Obesity wt still 180-185  Lbs after 2 months adipex and wants to continue another r2 months adipex does help appetite she last took it 2 weeks ago. She is not working out and eats potatoes carbs at times 2. Hypothyroidism on armour thyroid 15 mg qd and tolerating  3. Annual   ROS: See pertinent positives and negatives per HPI. General: wt stable  HEENT: no sore throat  CV: no chest pain  Lungs: no sob  GI: no abdominal pain  Skin: no issues MSK: no joint pain  GU: no issues Psych:no anxiety/depression Neuro: no h/a dizziness   Past Medical History:  Diagnosis Date  . Anemia    blood transfusion 2003  . Frequent headaches   . Kidney stone   . Subclinical hypothyroidism   . Thyroid disease   . Vaginal bleeding   . Vitamin D deficiency     Past Surgical History:  Procedure Laterality Date  . ABDOMINAL HYSTERECTOMY     Partial  . FACIAL RECONSTRUCTION SURGERY  May 2003  . LAPAROSCOPIC HYSTERECTOMY  2011   11/01/10 still has cervix     Family History  Problem Relation Age of Onset  . Diabetes Father   . Thyroid disease Mother   . Depression Mother   . Anxiety disorder Mother   . Cancer Maternal Grandmother        breast cancer  . Thyroid disease Maternal Grandmother   . Breast cancer Maternal Grandmother   . Diabetes Maternal Grandfather     SOCIAL HX: married with kids     Current Outpatient Medications:  .  Cholecalciferol (D-3-5) 5000 units capsule,  Take 5,000 Units by mouth daily., Disp: , Rfl:  .  phentermine (ADIPEX-P) 37.5 MG tablet, Take 1 tablet (37.5 mg total) by mouth daily before breakfast., Disp: 60 tablet, Rfl: 0 .  thyroid (ARMOUR THYROID) 15 MG tablet, Take 1 tablet (15 mg total) by mouth daily., Disp: 90 tablet, Rfl: 1  EXAM:  VITALS per patient if applicable:  GENERAL: alert, oriented, appears well and in no acute distress  HEENT: atraumatic, conjunttiva clear, no obvious abnormalities on inspection of external nose and ears  NECK: normal movements of the head and neck  LUNGS: on inspection no signs of respiratory distress, breathing rate appears normal, no obvious gross SOB, gasping or wheezing  CV: no obvious cyanosis  MS: moves all visible extremities without noticeable abnormality  PSYCH/NEURO: pleasant and cooperative, no obvious depression or anxiety, speech and thought processing grossly intact  ASSESSMENT AND PLAN:  Discussed the following assessment and plan:  Annual physical exam - Plan: Flu shot declines Tdap had 02/18/11  HPV given info  Given hep B3/3 hep b immune  Pap 04/28/18 neg no HPV no further abnormal vaginal bleeding hold on OB/GYN referral, s/p parital hysterectomy  Fasting lipid  And tsh f/u labs  Mammogram age 38 yo    Continue healthy diet choices and exercisecongratulated on wt loss  Obesity (BMI 35.0-39.9 without comorbidity) - Plan: phentermine (ADIPEX-P)  37.5 MG tablet qd x 2 months then will need 3 month break  -reduce carbs   Subclinical hypothyroidism - Plan:Armour thyroid 15 mg qd        I discussed the assessment and treatment plan with the patient. The patient was provided an opportunity to ask questions and all were answered. The patient agreed with the plan and demonstrated an understanding of the instructions.   The patient was advised to call back or seek an in-person evaluation if the symptoms worsen or if the condition fails to improve as  anticipated.  Time spent 15 minutes  Delorise Jackson, MD

## 2019-07-12 DIAGNOSIS — M5416 Radiculopathy, lumbar region: Secondary | ICD-10-CM | POA: Diagnosis not present

## 2019-07-12 DIAGNOSIS — R51 Headache: Secondary | ICD-10-CM | POA: Diagnosis not present

## 2019-07-12 DIAGNOSIS — M9903 Segmental and somatic dysfunction of lumbar region: Secondary | ICD-10-CM | POA: Diagnosis not present

## 2019-07-12 DIAGNOSIS — M9901 Segmental and somatic dysfunction of cervical region: Secondary | ICD-10-CM | POA: Diagnosis not present

## 2019-08-03 ENCOUNTER — Other Ambulatory Visit: Payer: Self-pay

## 2019-08-03 ENCOUNTER — Encounter: Payer: Self-pay | Admitting: Internal Medicine

## 2019-08-03 ENCOUNTER — Ambulatory Visit (INDEPENDENT_AMBULATORY_CARE_PROVIDER_SITE_OTHER): Payer: BC Managed Care – PPO | Admitting: Internal Medicine

## 2019-08-03 VITALS — Ht 62.0 in | Wt 175.0 lb

## 2019-08-03 DIAGNOSIS — E669 Obesity, unspecified: Secondary | ICD-10-CM

## 2019-08-03 DIAGNOSIS — R233 Spontaneous ecchymoses: Secondary | ICD-10-CM

## 2019-08-03 DIAGNOSIS — R238 Other skin changes: Secondary | ICD-10-CM | POA: Diagnosis not present

## 2019-08-03 DIAGNOSIS — E785 Hyperlipidemia, unspecified: Secondary | ICD-10-CM | POA: Diagnosis not present

## 2019-08-03 DIAGNOSIS — E561 Deficiency of vitamin K: Secondary | ICD-10-CM

## 2019-08-03 DIAGNOSIS — E063 Autoimmune thyroiditis: Secondary | ICD-10-CM

## 2019-08-03 DIAGNOSIS — R5383 Other fatigue: Secondary | ICD-10-CM | POA: Diagnosis not present

## 2019-08-03 DIAGNOSIS — E038 Other specified hypothyroidism: Secondary | ICD-10-CM | POA: Diagnosis not present

## 2019-08-03 DIAGNOSIS — Z1389 Encounter for screening for other disorder: Secondary | ICD-10-CM

## 2019-08-03 NOTE — Progress Notes (Signed)
Virtual Visit via Video Note  I connected with Bethany Mitchell   on 08/03/19 at  9:30 AM EDT by a video enabled telemedicine application and verified that I am speaking with the correct person using two identifiers. 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 The patient expressed understanding and agreed to proceed.   HPI: 1. C/o fatigue and easy bruising which is new x 3-4 weeks bruising is left elbow x 2 and thighs inside of legs and back spontaneous bruising w/o trauma. She is not on meds which can cause this I.e adipex or armour thyroid reviewed. She has no FH of bleeding d/o. Denies gums bleeding. She does not have menstrual cycles. Denies sob. She is sleeping 6-8 hours at night and exercising  She has been snapping at the kids more often when asked about mood.   2.obesity wt loss down to 175/6 on adipex from 180/5 in 05/2019 shill on adipex but will need to get herself a break of 3 months after completes this dose    ROS: See pertinent positives and negatives per HPI.  Past Medical History:  Diagnosis Date  . Anemia    blood transfusion 2003  . Frequent headaches   . Kidney stone   . Subclinical hypothyroidism   . Thyroid disease   . Vaginal bleeding   . Vitamin D deficiency     Past Surgical History:  Procedure Laterality Date  . ABDOMINAL HYSTERECTOMY     Partial  . FACIAL RECONSTRUCTION SURGERY  May 2003  . LAPAROSCOPIC HYSTERECTOMY  2011   11/01/10 still has cervix     Family History  Problem Relation Age of Onset  . Diabetes Father   . Thyroid disease Mother   . Depression Mother   . Anxiety disorder Mother   . Cancer Maternal Grandmother        breast cancer  . Thyroid disease Maternal Grandmother   . Breast cancer Maternal Grandmother   . 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  Cholecalciferol (D-3-5) 5000 units capsule, Take 5,000 Units by mouth daily., Disp: , Rfl:  .  phentermine (ADIPEX-P) 37.5 MG tablet, Take 1 tablet (37.5 mg total) by mouth daily before breakfast., Disp: 60 tablet, Rfl: 0 .  thyroid (ARMOUR THYROID) 15 MG tablet, Take 1 tablet (15 mg total) by mouth daily., Disp: 90 tablet, Rfl: 1  EXAM:  VITALS per patient if applicable:  GENERAL: alert, oriented, appears well and in no acute distress  HEENT: atraumatic, conjunttiva clear, no obvious abnormalities on inspection of external nose and ears  NECK: normal movements of the head and neck  LUNGS: on inspection no signs of respiratory distress, breathing rate appears normal, no obvious gross SOB, gasping or wheezing  CV: no obvious cyanosis  MS: moves all visible extremities without noticeable abnormality  PSYCH/NEURO: pleasant and cooperative, no obvious depression or anxiety, speech and thought processing grossly intact  Skin: bruise x 2 left elbow  ASSESSMENT AND PLAN:  Discussed the following assessment and plan:  Fatigue, unspecified  type ? Etiology anxiety vs other organic etiology- Plan: DG Chest 2 View Check labs and CXR if both negative consider US abdomen  To r/o GI etiology as cause of fatigue and easy bruising if negative consider CT ab/pelvis  She is sleeping 6-8 hours not likely etiology -if not etiology found consider SSRI/SSNRI with h/o depression   Easy bruising - Plan: DG Chest 2 View, PT AND PTT, Vitamin K1, Serum, Cortisol  Hypothyroidism due to Hashimoto's thyroiditis - Plan: TSH, Thyroid peroxidase antibody, T4, free, T3, free  Hyperlipidemia, unspecified hyperlipidemia  type - Plan: Lipid panel   Obesity bmi 32  -still on adipex 2 month supply since 05/2019  After this dose complete will need a 3 month off break but continue healthy diet and exercise   HM Flu shot declines Tdap had 02/18/11  HPV given info  hep b immune   Pap 04/28/18 neg no HPV no further abnormal vaginal bleeding hold on OB/GYN referral, s/p parital hysterectomy and no cycles   Fasting labs 08/21/19   Mammogram age 24 yo    Continue healthy diet choices and exercisecongratulated on wt loss    I discussed the assessment and treatment plan with the patient. The patient was provided an opportunity to ask questions and all were answered. The patient agreed with the plan and demonstrated an understanding of the instructions.   The patient was advised to call back or seek an in-person evaluation if the symptoms worsen or if the condition fails to improve as anticipated.  Time spent 20 minutes  Delorise Jackson, MD

## 2019-08-03 NOTE — Patient Instructions (Signed)
Easy bruising could be due to underlying cause or vitamin E, ginger, gingko or Ibuprofen/motrin/Alevel supplements can do this   Please do chest Xray and we will consider Ultrasound of abdomen if CXR negative as well as labs    Fatigue If you have fatigue, you feel tired all the time and have a lack of energy or a lack of motivation. Fatigue may make it difficult to start or complete tasks because of exhaustion. In general, occasional or mild fatigue is often a normal response to activity or life. However, long-lasting (chronic) or extreme fatigue may be a symptom of a medical condition. Follow these instructions at home: General instructions  Watch your fatigue for any changes.  Go to bed and get up at the same time every day.  Avoid fatigue by pacing yourself during the day and getting enough sleep at night.  Maintain a healthy weight. Medicines  Take over-the-counter and prescription medicines only as told by your health care provider.  Take a multivitamin, if told by your health care provider.  Do not use herbal or dietary supplements unless they are approved by your health care provider. Activity   Exercise regularly, as told by your health care provider.  Use or practice techniques to help you relax, such as yoga, tai chi, meditation, or massage therapy. Eating and drinking   Avoid heavy meals in the evening.  Eat a well-balanced diet, which includes lean proteins, whole grains, plenty of fruits and vegetables, and low-fat dairy products.  Avoid consuming too much caffeine.  Avoid the use of alcohol.  Drink enough fluid to keep your urine pale yellow. Lifestyle  Change situations that cause you stress. Try to keep your work and personal schedule in balance.  Do not use any products that contain nicotine or tobacco, such as cigarettes and e-cigarettes. If you need help quitting, ask your health care provider.  Do not use drugs. Contact a health care provider if:   Your fatigue does not get better.  You have a fever.  You suddenly lose or gain weight.  You have headaches.  You have trouble falling asleep or sleeping through the night.  You feel angry, guilty, anxious, or sad.  You are unable to have a bowel movement (constipation).  Your skin is dry.  You have swelling in your legs or another part of your body. Get help right away if:  You feel confused.  Your vision is blurry.  You feel faint or you pass out.  You have a severe headache.  You have severe pain in your abdomen, your back, or the area between your waist and hips (pelvis).  You have chest pain, shortness of breath, or an irregular or fast heartbeat.  You are unable to urinate, or you urinate less than normal.  You have abnormal bleeding, such as bleeding from the rectum, vagina, nose, lungs, or nipples.  You vomit blood.  You have thoughts about hurting yourself or others. If you ever feel like you may hurt yourself or others, or have thoughts about taking your own life, get help right away. You can go to your nearest emergency department or call:  Your local emergency services (911 in the [place]  A suicide crisis helpline, such as the [place] at 740-282-1316. This is open 24 hours a day. Summary  If you have fatigue, you feel tired all the time and have a lack of energy or a lack of motivation.  Fatigue may make it difficult to  start or complete tasks because of exhaustion.  Long-lasting (chronic) or extreme fatigue may be a symptom of a medical condition.  Exercise regularly, as told by your health care provider.  Change situations that cause you stress. Try to keep your work and personal schedule in balance. This information is not intended to replace advice given to you by your health care provider. Make sure you discuss any questions you have with your health care provider. Document Released: 09/13/2007 Document Revised:  03/09/2019 Document Reviewed: 08/11/2017 Elsevier Patient Education  2020 [employer].

## 2019-08-04 ENCOUNTER — Ambulatory Visit
Admission: RE | Admit: 2019-08-04 | Discharge: 2019-08-04 | Disposition: A | Discharge status: Home / Self Care | Payer: BC Managed Care – PPO | Source: Ambulatory Visit | Attending: Internal Medicine | Admitting: Internal Medicine

## 2019-08-04 ENCOUNTER — Other Ambulatory Visit: Payer: Self-pay

## 2019-08-04 DIAGNOSIS — R238 Other skin changes: Secondary | ICD-10-CM | POA: Insufficient documentation

## 2019-08-04 DIAGNOSIS — R233 Spontaneous ecchymoses: Secondary | ICD-10-CM

## 2019-08-04 DIAGNOSIS — R5383 Other fatigue: Secondary | ICD-10-CM

## 2019-08-08 ENCOUNTER — Telehealth: Payer: Self-pay

## 2019-08-08 NOTE — Telephone Encounter (Signed)
Patient was informed of results.  Patient understood and no questions, comments, or concerns at this time.  Copied from [place] [phone]. Topic: Quick Communication - See Telephone Encounter >> Aug 08, 2019  1:02 PM Babs Bertin, CMA wrote: CRM for notification. See Telephone encounter for: 08/08/19. >> Aug 08, 2019  1:23 PM Yvette Rack wrote: Pt stated she was returning call to [facility]. Pt requests call back.

## 2019-08-21 ENCOUNTER — Other Ambulatory Visit (INDEPENDENT_AMBULATORY_CARE_PROVIDER_SITE_OTHER): Payer: BC Managed Care – PPO

## 2019-08-21 ENCOUNTER — Other Ambulatory Visit: Payer: Self-pay

## 2019-08-21 DIAGNOSIS — R238 Other skin changes: Secondary | ICD-10-CM

## 2019-08-21 DIAGNOSIS — E038 Other specified hypothyroidism: Secondary | ICD-10-CM

## 2019-08-21 DIAGNOSIS — Z1389 Encounter for screening for other disorder: Secondary | ICD-10-CM

## 2019-08-21 DIAGNOSIS — E785 Hyperlipidemia, unspecified: Secondary | ICD-10-CM | POA: Diagnosis not present

## 2019-08-21 DIAGNOSIS — R233 Spontaneous ecchymoses: Secondary | ICD-10-CM

## 2019-08-21 DIAGNOSIS — E063 Autoimmune thyroiditis: Secondary | ICD-10-CM | POA: Diagnosis not present

## 2019-08-21 DIAGNOSIS — R5383 Other fatigue: Secondary | ICD-10-CM | POA: Diagnosis not present

## 2019-08-21 DIAGNOSIS — E561 Deficiency of vitamin K: Secondary | ICD-10-CM

## 2019-08-21 LAB — COMPREHENSIVE METABOLIC PANEL
ALT: 10 U/L (ref 0–35)
AST: 12 U/L (ref 0–37)
Albumin: 4.2 g/dL (ref 3.5–5.2)
Alkaline Phosphatase: 80 U/L (ref 39–117)
BUN: 14 mg/dL (ref 6–23)
CO2: 27 mEq/L (ref 19–32)
Calcium: 9.3 mg/dL (ref 8.4–10.5)
Chloride: 105 mEq/L (ref 96–112)
Creatinine, Ser: 0.77 mg/dL (ref 0.40–1.20)
GFR: 84.87 mL/min (ref >60.00)
Glucose, Bld: 91 mg/dL (ref 70–99)
Potassium: 4.1 mEq/L (ref 3.5–5.1)
Sodium: 139 mEq/L (ref 135–145)
Total Bilirubin: 0.7 mg/dL (ref 0.2–1.2)
Total Protein: 6.6 g/dL (ref 6.0–8.3)

## 2019-08-21 LAB — CBC WITH DIFFERENTIAL/PLATELET
Basophils Absolute: 0 10*3/uL (ref 0.0–0.1)
Basophils Relative: 0.3 % (ref 0.0–3.0)
Eosinophils Absolute: 0 10*3/uL (ref 0.0–0.7)
Eosinophils Relative: 0.7 % (ref 0.0–5.0)
HCT: 41.3 % (ref 36.0–46.0)
Hemoglobin: 14 g/dL (ref 12.0–15.0)
Lymphocytes Relative: 32.9 % (ref 12.0–46.0)
Lymphs Abs: 1.8 10*3/uL (ref 0.7–4.0)
MCHC: 33.9 g/dL (ref 30.0–36.0)
MCV: 85.9 fl (ref 78.0–100.0)
Monocytes Absolute: 0.3 10*3/uL (ref 0.1–1.0)
Monocytes Relative: 5.6 % (ref 3.0–12.0)
Neutro Abs: 3.4 10*3/uL (ref 1.4–7.7)
Neutrophils Relative %: 60.5 % (ref 43.0–77.0)
Platelets: 217 10*3/uL (ref 150.0–400.0)
RBC: 4.81 Mil/uL (ref 3.87–5.11)
RDW: 12.8 % (ref 11.5–15.5)
WBC: 5.6 10*3/uL (ref 4.0–10.5)

## 2019-08-21 LAB — LIPID PANEL
Cholesterol: 126 mg/dL (ref 0–200)
HDL: 44.3 mg/dL (ref >39.00)
LDL Cholesterol: 70 mg/dL (ref 0–99)
NonHDL: 81.95
Total CHOL/HDL Ratio: 3
Triglycerides: 60 mg/dL (ref 0.0–149.0)
VLDL: 12 mg/dL (ref 0.0–40.0)

## 2019-08-21 LAB — TSH: TSH: 6.13 u[IU]/mL — ABNORMAL HIGH (ref 0.35–4.50)

## 2019-08-21 LAB — T3, FREE: T3, Free: 3.4 pg/mL (ref 2.3–4.2)

## 2019-08-21 LAB — T4, FREE: Free T4: 0.7 ng/dL (ref 0.60–1.60)

## 2019-08-21 LAB — CORTISOL: Cortisol, Plasma: 8.3 ug/dL

## 2019-08-22 LAB — PT AND PTT
INR: 1 (ref 0.8–1.2)
Prothrombin Time: 10.5 s (ref 9.1–12.0)
aPTT: 30 s (ref 24–33)

## 2019-08-26 LAB — URINALYSIS, ROUTINE W REFLEX MICROSCOPIC
Bilirubin Urine: NEGATIVE
Glucose, UA: NEGATIVE
Hgb urine dipstick: NEGATIVE
Ketones, ur: NEGATIVE
Leukocytes,Ua: NEGATIVE
Nitrite: NEGATIVE
Protein, ur: NEGATIVE
Specific Gravity, Urine: 1.02 (ref 1.001–1.03)
pH: 7.5 (ref 5.0–8.0)

## 2019-08-26 LAB — THYROID PEROXIDASE ANTIBODY: Thyroperoxidase Ab SerPl-aCnc: 80 IU/mL — ABNORMAL HIGH (ref <9)

## 2019-08-26 LAB — VITAMIN K1, SERUM: Vitamin K: 146 pg/mL (ref 130–1500)

## 2019-08-28 ENCOUNTER — Other Ambulatory Visit: Payer: Self-pay | Admitting: Internal Medicine

## 2019-08-28 ENCOUNTER — Telehealth: Payer: Self-pay

## 2019-08-28 DIAGNOSIS — R300 Dysuria: Secondary | ICD-10-CM | POA: Diagnosis not present

## 2019-08-28 DIAGNOSIS — R946 Abnormal results of thyroid function studies: Secondary | ICD-10-CM

## 2019-08-28 DIAGNOSIS — E039 Hypothyroidism, unspecified: Secondary | ICD-10-CM

## 2019-08-28 DIAGNOSIS — R319 Hematuria, unspecified: Secondary | ICD-10-CM

## 2019-08-28 DIAGNOSIS — R233 Spontaneous ecchymoses: Secondary | ICD-10-CM

## 2019-08-28 DIAGNOSIS — R238 Other skin changes: Secondary | ICD-10-CM

## 2019-08-28 DIAGNOSIS — N390 Urinary tract infection, site not specified: Secondary | ICD-10-CM | POA: Diagnosis not present

## 2019-08-28 DIAGNOSIS — R5383 Other fatigue: Secondary | ICD-10-CM

## 2019-08-28 NOTE — Telephone Encounter (Signed)
Referred to [facility] endocrine  Fransisco Beau inform patient -not sure who her mom sees but if she finds out by asking her mom when they call her she can request them  Melissa please send copy of all labs 08/21/19 please    Fransisco Beau  -have pt come in for urine check asap orders in   I dont have a lab reason for easy bruising  -does she want me to order an [place] of her abdomen ???? -->to look at internal organs like her spleen to see if internal organs ok, as this could be a source of fatigue and easy bruising   Also thyroid being uncontrolled can make her tired   If not better after seeing endocrine sch f/u with me asap

## 2019-08-28 NOTE — Telephone Encounter (Signed)
Patient was informed of results.  Patient understood and no questions, comments, or concerns at this time. She is ok with the thyroid specialist, the one in [facility] her mom sees. She is still having easy bruising.  Patient also is having some issues with her urine. She has had blood this Saturday, frequent urination and she is taking azo but no help with SX.  Copied from [place] [phone]. Topic: Quick Communication - Lab Results (Clinic Use ONLY) >> Aug 28, 2019  2:20 PM Pauline Good wrote: Pt want call back from lab result

## 2019-08-29 NOTE — Telephone Encounter (Signed)
Patient has been informed. She went to [facility] yesterday, she was placed on cipro. Awaiting culture. She wants to wait on the [place] until after her thyroid gets straighten out. If still having sx afterward then she will be ok with [place].

## 2019-08-29 NOTE — Telephone Encounter (Signed)
Noted referral placed [facility] endocrine  Hold US abdomen for now   [place]

## 2019-09-22 DIAGNOSIS — E669 Obesity, unspecified: Secondary | ICD-10-CM | POA: Diagnosis not present

## 2019-09-22 DIAGNOSIS — E063 Autoimmune thyroiditis: Secondary | ICD-10-CM | POA: Diagnosis not present

## 2019-10-05 IMAGING — US US THYROID
1 series · 14 of 25 positions shown · non-contrast
Comparison: 07/11/2009

CLINICAL DATA: Goiter.  Hypothyroidism.

EXAM:
THYROID ULTRASOUND
TECHNIQUE: Ultrasound examination of the thyroid gland and adjacent soft
tissues was performed.

[Series 1: us thyroid · 0.07mm/px · 14 of 40 slices shown]
[im 1/40]
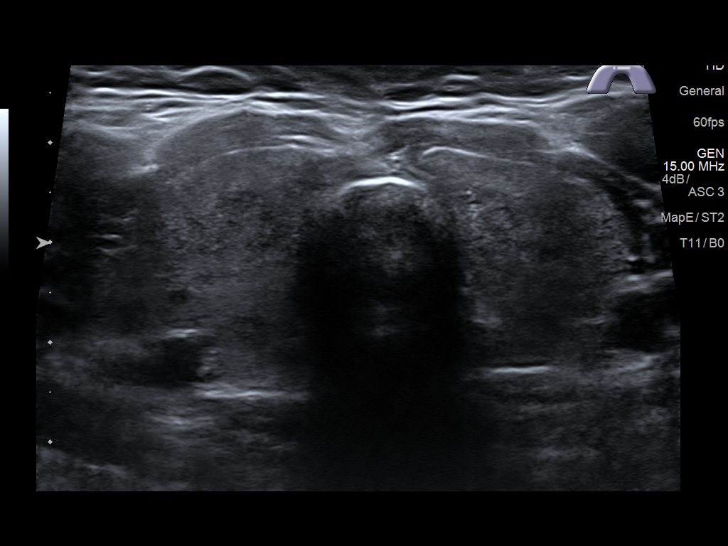
[im 4/40]
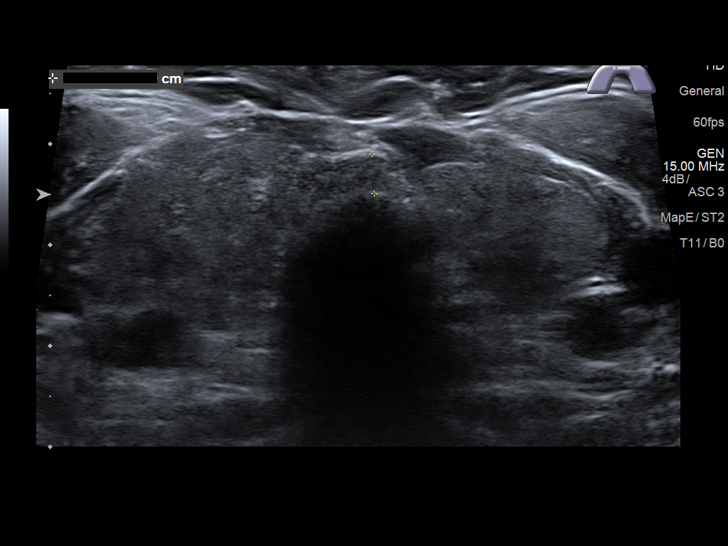
[im 7/40]
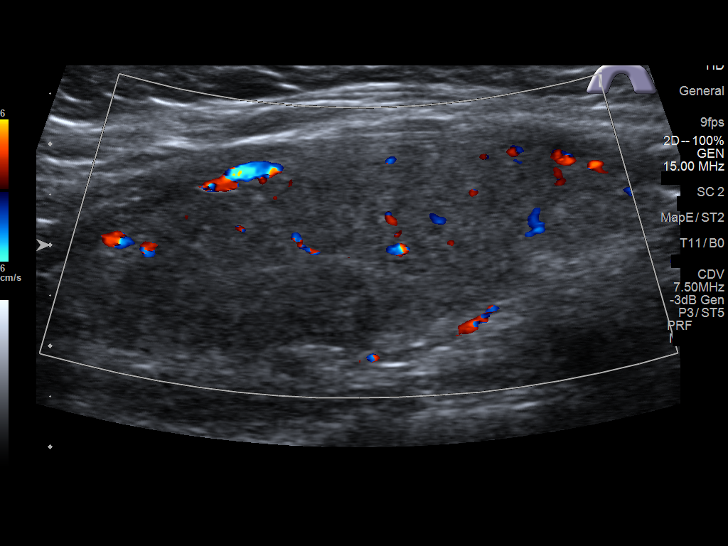
[im 10/40]
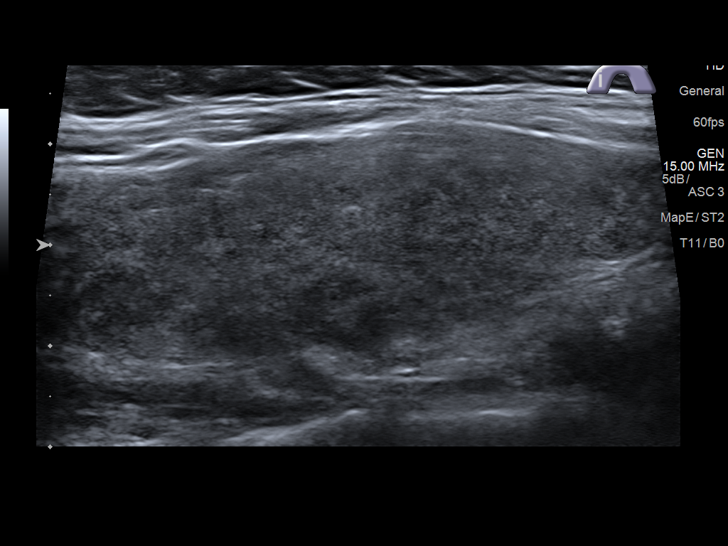
[im 14/40]
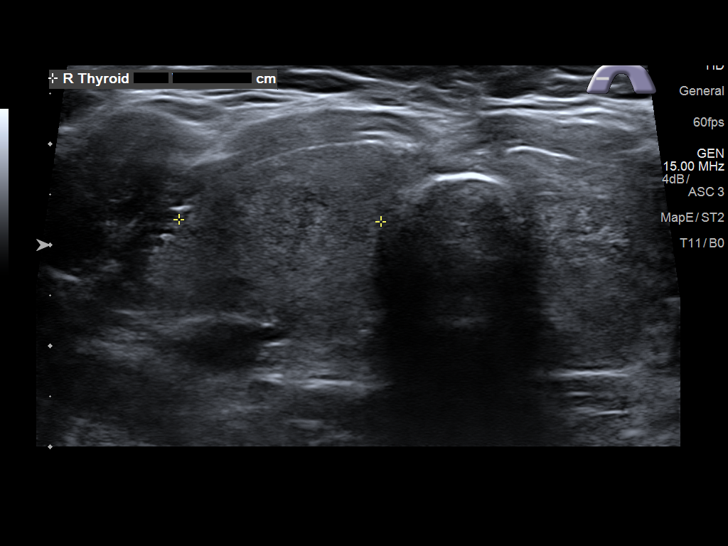
[im 15/40]
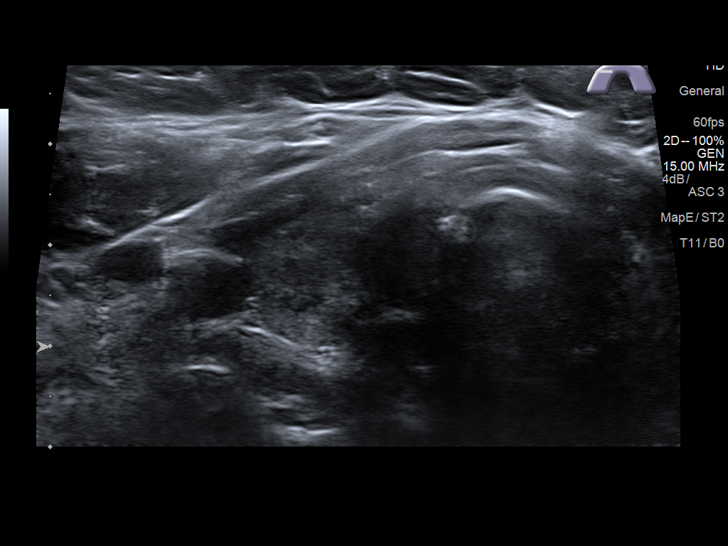
[im 18/40]
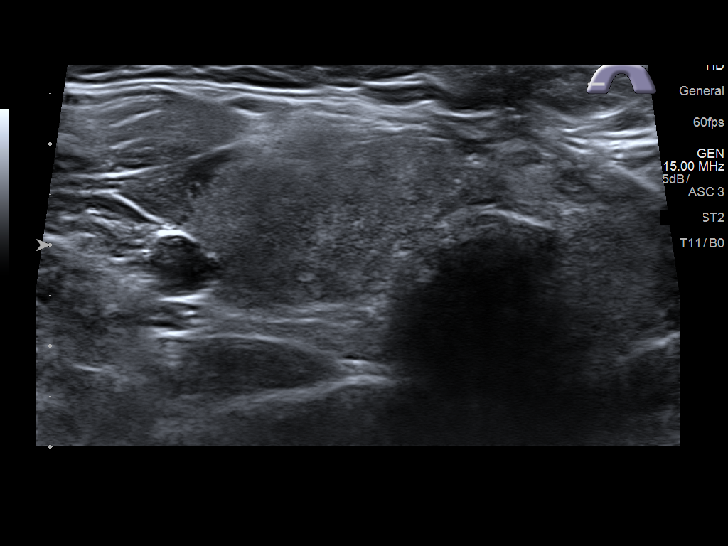
[im 22/40]
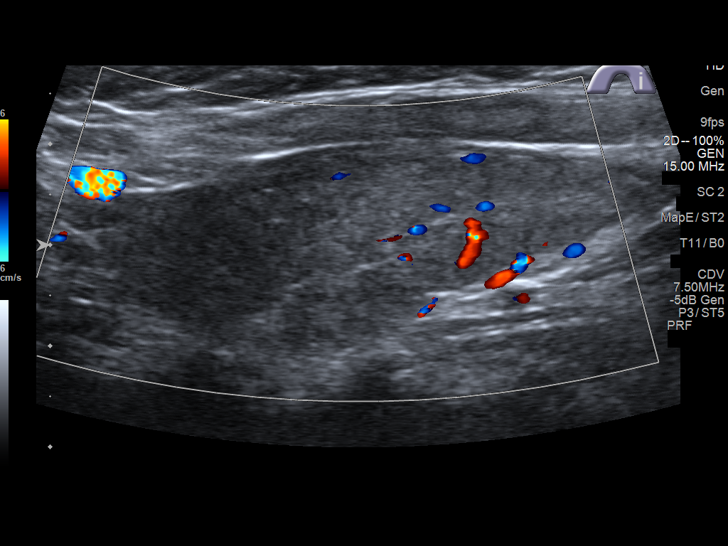
[im 25/40]
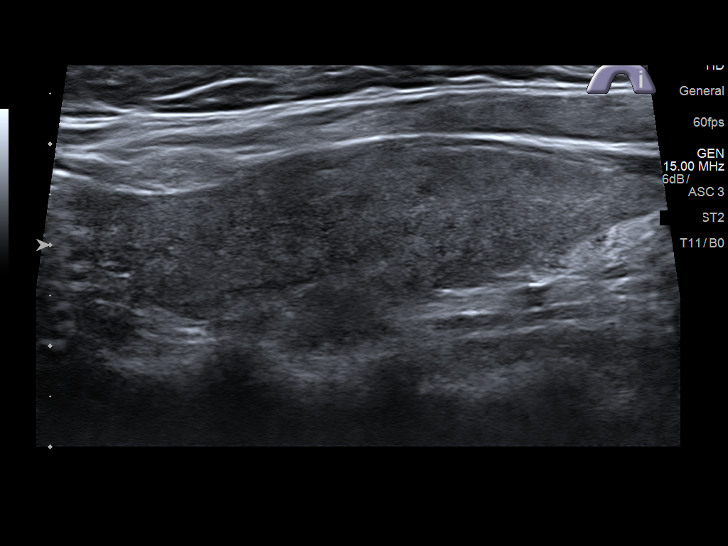
[im 27/40]
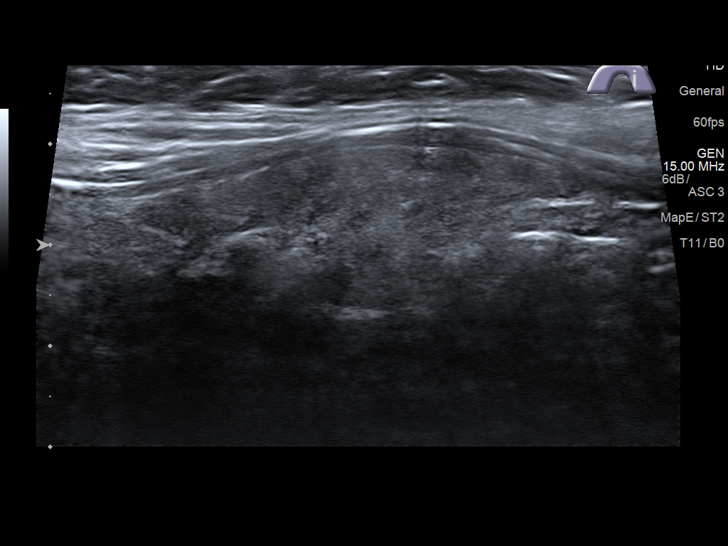
[im 30/40]
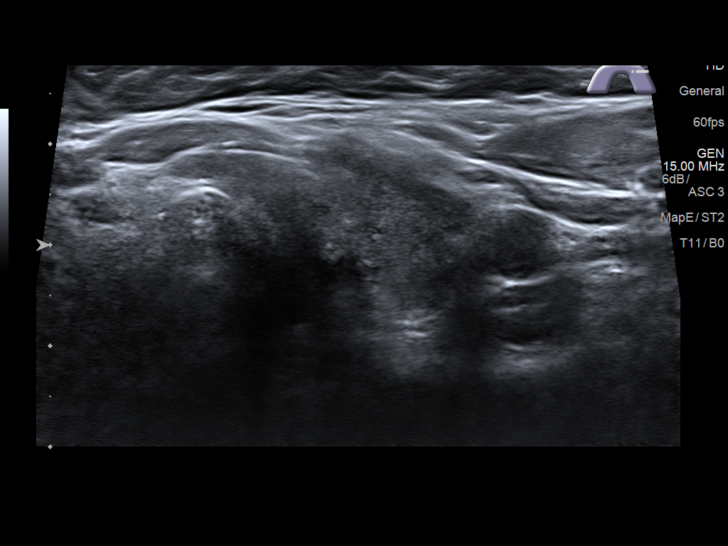
[im 33/40]
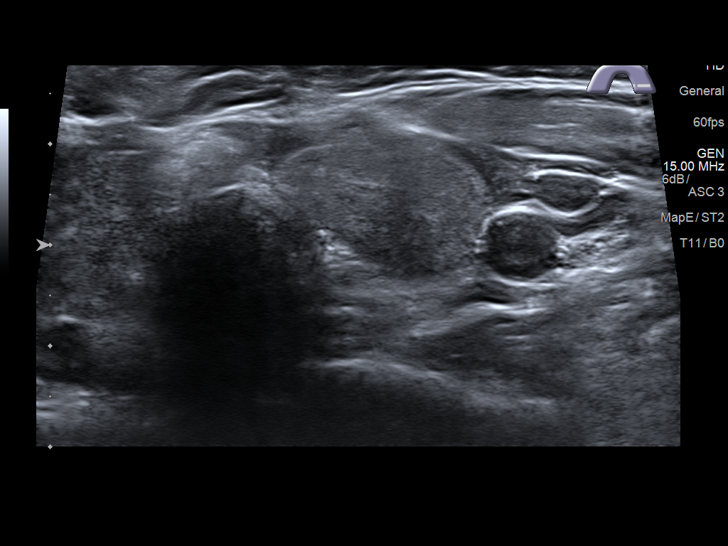
[im 36/40]
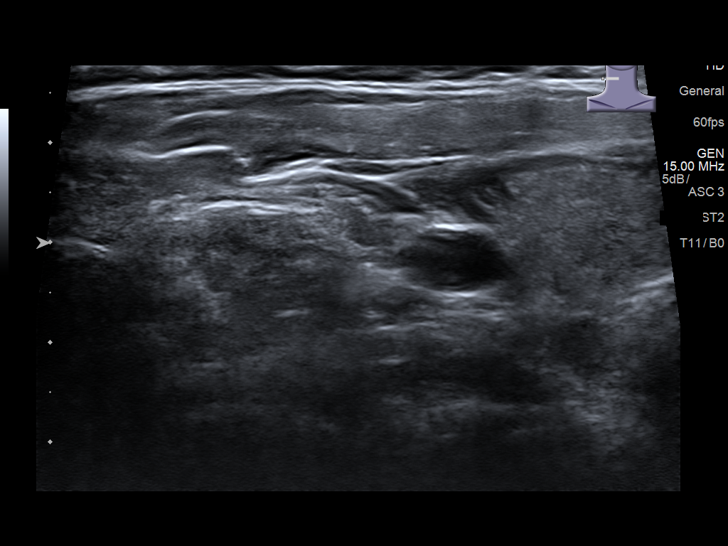
[im 40/40]
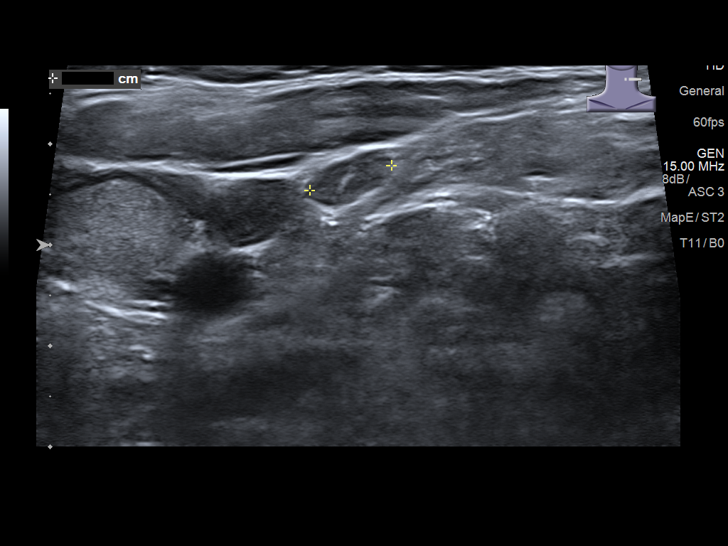

[14 of 25 positions shown; findings below may reference images not displayed]

FINDINGS: Parenchymal Echotexture: Moderately heterogenous

Isthmus: 0.4 cm, previously 0.3 cm

Right lobe: 6.3 x 2.1 x 2.0 cm, previously 5.7 x 2.2 x 3.2 cm

Left lobe: 5.3 x 2.0 x 1.8 cm, previously 5.5 x 2.5 x 2.5 cm

_________________________________________________________

Estimated total number of nodules >/= 1 cm: 0

Number of spongiform nodules >/=  2 cm not described below (TR1): 0

Number of mixed cystic and solid nodules >/= 1.5 cm not described
below (TR2): 0

_________________________________________________________

No discrete nodules are seen within the thyroid gland.

Small normal appearing lymph nodes on left side of the neck.
IMPRESSION: Thyroid appearance has minimally changed since 7434. The thyroid
remains enlarged and heterogeneous.

No discrete thyroid nodules.

## 2019-10-24 ENCOUNTER — Ambulatory Visit (INDEPENDENT_AMBULATORY_CARE_PROVIDER_SITE_OTHER): Payer: BC Managed Care – PPO | Admitting: Internal Medicine

## 2019-10-24 ENCOUNTER — Encounter: Payer: Self-pay | Admitting: Internal Medicine

## 2019-10-24 ENCOUNTER — Other Ambulatory Visit: Payer: Self-pay

## 2019-10-24 VITALS — Ht 62.5 in | Wt 173.0 lb

## 2019-10-24 DIAGNOSIS — B373 Candidiasis of vulva and vagina: Secondary | ICD-10-CM | POA: Diagnosis not present

## 2019-10-24 DIAGNOSIS — E669 Obesity, unspecified: Secondary | ICD-10-CM

## 2019-10-24 DIAGNOSIS — E038 Other specified hypothyroidism: Secondary | ICD-10-CM

## 2019-10-24 DIAGNOSIS — E039 Hypothyroidism, unspecified: Secondary | ICD-10-CM | POA: Diagnosis not present

## 2019-10-24 DIAGNOSIS — L309 Dermatitis, unspecified: Secondary | ICD-10-CM

## 2019-10-24 DIAGNOSIS — L989 Disorder of the skin and subcutaneous tissue, unspecified: Secondary | ICD-10-CM

## 2019-10-24 DIAGNOSIS — B3731 Acute candidiasis of vulva and vagina: Secondary | ICD-10-CM

## 2019-10-24 MED ORDER — HYDROCORTISONE 2.5 % EX CREA: TOPICAL_CREAM | Freq: Two times a day (BID) | CUTANEOUS | 0 refills | Status: DC

## 2019-10-24 MED ORDER — PHENTERMINE HCL 37.5 MG PO TABS: 37.5000 mg | ORAL_TABLET | Freq: Every day | ORAL | 0 refills | Status: DC

## 2019-10-24 MED ORDER — FLUCONAZOLE 150 MG PO TABS: 150.0000 mg | ORAL_TABLET | Freq: Once | ORAL | 0 refills | Status: AC

## 2019-10-24 MED ORDER — THYROID 30 MG PO TABS: 30.0000 mg | ORAL_TABLET | Freq: Every day | ORAL | Status: DC

## 2019-10-24 MED ORDER — MUPIROCIN 2 % EX OINT: 1.0000 "application " | TOPICAL_OINTMENT | Freq: Three times a day (TID) | CUTANEOUS | 0 refills | Status: DC

## 2019-10-24 NOTE — Patient Instructions (Signed)
Preventing Unhealthy Weight Gain, Adult Staying at a healthy weight is important to your overall health. When fat builds up in your body, you may become overweight or obese. Being overweight or obese increases your risk of developing certain health problems, such as heart disease, diabetes, sleeping problems, joint problems, and some types of cancer. Unhealthy weight gain is often the result of making unhealthy food choices or not getting enough exercise. You can make changes to your lifestyle to prevent obesity and stay as healthy as possible. What nutrition changes can be made?   Eat only as much as your body needs. To do this: ? Pay attention to signs that you are hungry or full. Stop eating as soon as you feel full. ? If you feel hungry, try drinking water first before eating. Drink enough water so your urine is clear or pale yellow. ? Eat smaller portions. Pay attention to portion sizes when eating out. ? Look at serving sizes on food labels. Most foods contain more than one serving per container. ? Eat the recommended number of calories for your gender and activity level. For most active people, a daily total of 2,000 calories is appropriate. If you are trying to lose weight or are not very active, you may need to eat fewer calories. Talk with your health care provider or a diet and nutrition specialist (dietitian) about how many calories you need each day.  Choose healthy foods, such as: ? Fruits and vegetables. At each meal, try to fill at least half of your plate with fruits and vegetables. ? Whole grains, such as whole-wheat bread, brown rice, and quinoa. ? Lean meats, such as chicken or fish. ? Other healthy proteins, such as beans, eggs, or tofu. ? Healthy fats, such as nuts, seeds, fatty fish, and olive oil. ? Low-fat or fat-free dairy products.  Check food labels, and avoid food and drinks that: ? Are high in calories. ? Have added sugar. ? Are high in sodium. ? Have  saturated fats or trans fats.  Cook foods in healthier ways, such as by baking, broiling, or grilling.  Make a meal plan for the week, and shop with a grocery list to help you stay on track with your purchases. Try to avoid going to the grocery store when you are hungry.  When grocery shopping, try to shop around the outside of the store first, where the fresh foods are. Doing this helps you to avoid prepackaged foods, which can be high in sugar, salt (sodium), and fat. What lifestyle changes can be made?   Exercise for 30 or more minutes on 5 or more days each week. Exercising may include brisk walking, yard work, biking, running, swimming, and team sports like basketball and soccer. Ask your health care provider which exercises are safe for you.  Do muscle-strengthening activities, such as lifting weights or using resistance bands, on 2 or more days a week.  Do not use any products that contain nicotine or tobacco, such as cigarettes and e-cigarettes. If you need help quitting, ask your health care provider.  Limit alcohol intake to no more than 1 drink a day for nonpregnant women and 2 drinks a day for men. One drink equals 12 oz of beer, 5 oz of wine, or 1 oz of hard liquor.  Try to get 7-9 hours of sleep each night. What other changes can be made?  Keep a food and activity journal to keep track of: ? What you ate and how many calories  you had. Remember to count the calories in sauces, dressings, and side dishes. ? Whether you were active, and what exercises you did. ? Your calorie, weight, and activity goals.  Check your weight regularly. Track any changes. If you notice you have gained weight, make changes to your diet or activity routine.  Avoid taking weight-loss medicines or supplements. Talk to your health care provider before starting any new medicine or supplement.  Talk to your health care provider before trying any new diet or exercise plan. Why are these changes  important? Eating healthy, staying active, and having healthy habits can help you to prevent obesity. Those changes also:  Help you manage stress and emotions.  Help you connect with friends and family.  Improve your self-esteem.  Improve your sleep.  Prevent long-term health problems. What can happen if changes are not made? Being obese or overweight can cause you to develop joint or bone problems, which can make it hard for you to stay active or do activities you enjoy. Being obese or overweight also puts stress on your heart and lungs and can lead to health problems like diabetes, heart disease, and some cancers. Where to find more information Talk with your health care provider or a dietitian about healthy eating and healthy lifestyle choices. You may also find information from:  [place]. Department of Agriculture, MyPlate: FormerBoss.no  American Heart Association: www.heart.org  Centers for Disease Control and Prevention: http://www.wolf.info/ Summary  Staying at a healthy weight is important to your overall health. It helps you to prevent certain diseases and health problems, such as heart disease, diabetes, joint problems, sleep disorders, and some types of cancer.  Being obese or overweight can cause you to develop joint or bone problems, which can make it hard for you to stay active or do activities you enjoy.  You can prevent unhealthy weight gain by eating a healthy diet, exercising regularly, not smoking, limiting alcohol, and getting enough sleep.  Talk with your health care provider or a dietitian for guidance about healthy eating and healthy lifestyle choices. This information is not intended to replace advice given to you by your health care provider. Make sure you discuss any questions you have with your health care provider. Document Released: 11/17/2016 Document Revised: 11/19/2017 Document Reviewed: 12/23/2016 Elsevier Patient Education  2020 [employer].   Exercising to Stay Healthy To become healthy and stay healthy, it is recommended that you do moderate-intensity and vigorous-intensity exercise. You can tell that you are exercising at a moderate intensity if your heart starts beating faster and you start breathing faster but can still hold a conversation. You can tell that you are exercising at a vigorous intensity if you are breathing much harder and faster and cannot hold a conversation while exercising. Exercising regularly is important. It has many health benefits, such as:  Improving overall fitness, flexibility, and endurance.  Increasing bone density.  Helping with weight control.  Decreasing body fat.  Increasing muscle strength.  Reducing stress and tension.  Improving overall health. How often should I exercise? Choose an activity that you enjoy, and set realistic goals. Your health care provider can help you make an activity plan that works for you. Exercise regularly as told by your health care provider. This may include:  Doing strength training two times a week, such as: ? Lifting weights. ? Using resistance bands. ? Push-ups. ? Sit-ups. ? Yoga.  Doing a certain intensity of exercise for a given amount of time. Choose from these  options: ? A total of 150 minutes of moderate-intensity exercise every week. ? A total of 75 minutes of vigorous-intensity exercise every week. ? A mix of moderate-intensity and vigorous-intensity exercise every week. Children, pregnant women, people who have not exercised regularly, people who are overweight, and older adults may need to talk with a health care provider about what activities are safe to do. If you have a medical condition, be sure to talk with your health care provider before you start a new exercise program. What are some exercise ideas? Moderate-intensity exercise ideas include:  Walking 1 mile (1.6 km) in about 15 minutes.  Biking.  Hiking.  Golfing.  Dancing.   Water aerobics. Vigorous-intensity exercise ideas include:  Walking 4.5 miles (7.2 km) or more in about 1 hour.  Jogging or running 5 miles (8 km) in about 1 hour.  Biking 10 miles (16.1 km) or more in about 1 hour.  Lap swimming.  Roller-skating or in-line skating.  Cross-country skiing.  Vigorous competitive sports, such as football, basketball, and soccer.  Jumping rope.  Aerobic dancing. What are some everyday activities that can help me to get exercise?  [place] work, such as: ? Pushing a Conservation officer, nature. ? Raking and bagging leaves.  Washing your car.  Pushing a stroller.  Shoveling snow.  Gardening.  Washing windows or floors. How can I be more active in my day-to-day activities?  Use stairs instead of an elevator.  Take a walk during your lunch break.  If you drive, park your car farther away from your work or school.  If you take public transportation, get off one stop early and walk the rest of the way.  Stand up or walk around during all of your indoor phone calls.  Get up, stretch, and walk around every 30 minutes throughout the day.  Enjoy exercise with a friend. Support to continue exercising will help you keep a regular routine of activity. What guidelines can I follow while exercising?  Before you start a new exercise program, talk with your health care provider.  Do not exercise so much that you hurt yourself, feel dizzy, or get very short of breath.  Wear comfortable clothes and wear shoes with good support.  Drink plenty of water while you exercise to prevent dehydration or heat stroke.  Work out until your breathing and your heartbeat get faster. Where to find more information  U.S. Department of Health and Human Services: BondedCompany.at  Centers for Disease Control and Prevention (CDC): http://www.wolf.info/ Summary  Exercising regularly is important. It will improve your overall fitness, flexibility, and endurance.  Regular exercise also will  improve your overall health. It can help you control your weight, reduce stress, and improve your bone density.  Do not exercise so much that you hurt yourself, feel dizzy, or get very short of breath.  Before you start a new exercise program, talk with your health care provider. This information is not intended to replace advice given to you by your health care provider. Make sure you discuss any questions you have with your health care provider. Document Released: 12/19/2010 Document Revised: 10/29/2017 Document Reviewed: 10/07/2017 Elsevier Patient Education  2020 [employer].  Budget-Friendly Healthy Eating There are many ways to save money at the grocery store and continue to eat healthy. You can be successful if you:  Plan meals according to your budget.  Make a grocery list and only purchase food according to your grocery list.  Prepare food yourself. What are tips for  following this plan?  Reading food labels  Compare food labels between brand name foods and the store brand. Often the nutritional value is the same, but the store brand is lower cost.  Look for products that do not have added sugar, fat, or salt (sodium). These often cost the same but are healthier for you. Products may be labeled as: ? Sugar-free. ? Nonfat. ? Low-fat. ? Sodium-free. ? Low-sodium.  Look for lean ground beef labeled as at least 92% lean and 8% fat. Shopping  Buy only the items on your grocery list and go only to the areas of the store that have the items on your list.  Use coupons only for foods and brands you normally buy. Avoid buying items you wouldn't normally buy simply because they are on sale.  Check online and in newspapers for weekly deals.  Buy healthy items from the bulk bins when available, such as herbs, spices, flour, pasta, nuts, and dried fruit.  Buy fruits and vegetables that are in season. Prices are usually lower on in-season produce.  Look at the unit price on the  price tag. Use it to compare different brands and sizes to find out which item is the best deal.  Choose healthy items that are often low-cost, such as carrots, potatoes, apples, bananas, and oranges. Dried or canned beans are a low-cost protein source.  Buy in bulk and freeze extra food. Items you can buy in bulk include meats, fish, poultry, frozen fruits, and frozen vegetables.  Avoid buying "ready-to-eat" foods, such as pre-cut fruits and vegetables and pre-made salads.  If possible, shop around to discover where you can find the best prices. Consider other retailers such as dollar stores, larger [employer], local fruit and vegetable stands, and farmers markets.  Do not shop when you are hungry. If you shop while hungry, it may be hard to stick to your list and budget.  Resist impulse buying. Use your grocery list as your official plan for the week.  Buy a variety of vegetables and fruits by purchasing fresh, frozen, and canned items.  Look at the top and bottom shelves for deals. Foods at eye level (eye level of an adult or child) are usually more expensive.  Be efficient with your time when shopping. The more time you spend at the store, the more money you are likely to spend.  To save money when choosing more expensive foods like meats and dairy: ? Choose cheaper cuts of meat, such as bone-in chicken thighs and drumsticks instead of skinless and boneless chicken. When you are ready to prepare the chicken, you can remove the skin yourself to make it healthier. ? Choose lean meats like chicken or [place] instead of beef. ? Choose canned seafood, such as tuna, salmon, or sardines. ? Buy eggs as a low-cost source of protein. ? Buy dried beans and peas, such as lentils, split peas, or kidney beans instead of meats. Dried beans and peas are a good alternative source of protein. ? Buy the larger tubs of yogurt instead of individual-sized containers.  Choose water instead of sodas and  other sweetened beverages.  Avoid buying chips, cookies, and other "junk food." These items are usually expensive and not healthy. Cooking  Make extra food and freeze the extras in meal-sized containers or in individual portions for fast meals and snacks.  Pre-cook on days when you have extra time to prepare meals in advance. You can keep these meals in the fridge or freezer and  reheat for a quick meal.  When you come home from the grocery store, wash, peel, and cut fruits and vegetables so they are ready to use and eat. This will help reduce food waste. Meal planning  Do not eat out or get fast food. Prepare food at home.  Make a grocery list and make sure to bring it with you to the store. If you have a smart phone, you could use your phone to create your shopping list.  Plan meals and snacks according to a grocery list and budget you create.  Use leftovers in your meal plan for the week.  Look for recipes where you can cook once and make enough food for two meals.  Include budget-friendly meals like stews, casseroles, and stir-fry dishes.  Try some meatless meals or try "no cook" meals like salads.  Make sure that half your plate is filled with fruits or vegetables. Choose from fresh, frozen, or canned fruits and vegetables. If eating canned, remember to rinse them before eating. This will remove any excess salt added for packaging. Summary  Eating healthy on a budget is possible if you plan your meals according to your budget, purchase according to your budget and grocery list, and prepare food yourself.  Tips for buying more food on a limited budget include buying generic brands, using coupons only for foods you normally buy, and buying healthy items from the bulk bins when available.  Tips for buying cheaper food to replace expensive food include choosing cheaper, lean cuts of meat, and buying dried beans and peas. This information is not intended to replace advice given to  you by your health care provider. Make sure you discuss any questions you have with your health care provider. Document Released: 07/20/2014 Document Revised: 11/17/2017 Document Reviewed: 11/17/2017 Elsevier Patient Education  2020 [place] Following a healthy eating pattern may help you to achieve and maintain a healthy body weight, reduce the risk of chronic disease, and live a long and productive life. It is important to follow a healthy eating pattern at an appropriate calorie level for your body. Your nutritional needs should be met primarily through food by choosing a variety of nutrient-rich foods. What are tips for following this plan? Reading food labels  Read labels and choose the following: ? Reduced or low sodium. ? Juices with 100% fruit juice. ? Foods with low saturated fats and high polyunsaturated and monounsaturated fats. ? Foods with whole grains, such as whole wheat, cracked wheat, brown rice, and wild rice. ? Whole grains that are fortified with folic acid. This is recommended for women who are pregnant or who want to become pregnant.  Read labels and avoid the following: ? Foods with a lot of added sugars. These include foods that contain brown sugar, corn sweetener, corn syrup, dextrose, fructose, glucose, high-fructose corn syrup, honey, invert sugar, lactose, malt syrup, maltose, molasses, raw sugar, sucrose, trehalose, or turbinado sugar.  Do not eat more than the following amounts of added sugar per day:  6 teaspoons (25 g) for women.  9 teaspoons (38 g) for men. ? Foods that contain processed or refined starches and grains. ? Refined grain products, such as white flour, degermed cornmeal, white bread, and white rice. Shopping  Choose nutrient-rich snacks, such as vegetables, whole fruits, and nuts. Avoid high-calorie and high-sugar snacks, such as potato chips, fruit snacks, and candy.  Use oil-based dressings and spreads on foods  instead of solid fats such as butter,  stick margarine, or cream cheese.  Limit pre-made sauces, mixes, and "instant" products such as flavored rice, instant noodles, and ready-made pasta.  Try more plant-protein sources, such as tofu, tempeh, black beans, edamame, lentils, nuts, and seeds.  Explore eating plans such as the Mediterranean diet or vegetarian diet. Cooking  Use oil to saut or stir-fry foods instead of solid fats such as butter, stick margarine, or lard.  Try baking, boiling, grilling, or broiling instead of frying.  Remove the fatty part of meats before cooking.  Steam vegetables in water or broth. Meal planning   At meals, imagine dividing your plate into fourths: ? One-half of your plate is fruits and vegetables. ? One-fourth of your plate is whole grains. ? One-fourth of your plate is protein, especially lean meats, poultry, eggs, tofu, beans, or nuts.  Include low-fat dairy as part of your daily diet. Lifestyle  Choose healthy options in all settings, including home, work, school, restaurants, or stores.  Prepare your food safely: ? Wash your hands after handling raw meats. ? Keep food preparation surfaces clean by regularly washing with hot, soapy water. ? Keep raw meats separate from ready-to-eat foods, such as fruits and vegetables. ? Cook seafood, meat, poultry, and eggs to the recommended internal temperature. ? Store foods at safe temperatures. In general:  Keep cold foods at 78F (4.4C) or below.  Keep hot foods at 178F (60C) or above.  Keep your freezer at [facility] (-17.8C) or below.  Foods are no longer safe to eat when they have been between the temperatures of 40-178F (4.4-60C) for more than 2 hours. What foods should I eat? Fruits Aim to eat 2 cup-equivalents of fresh, canned (in natural juice), or frozen fruits each day. Examples of 1 cup-equivalent of fruit include 1 small apple, 8 large strawberries, 1 cup canned fruit,  cup dried  fruit, or 1 cup 100% juice. Vegetables Aim to eat 2-3 cup-equivalents of fresh and frozen vegetables each day, including different varieties and colors. Examples of 1 cup-equivalent of vegetables include 2 medium carrots, 2 cups raw, leafy greens, 1 cup chopped vegetable (raw or cooked), or 1 medium baked potato. Grains Aim to eat 6 ounce-equivalents of whole grains each day. Examples of 1 ounce-equivalent of grains include 1 slice of bread, 1 cup ready-to-eat cereal, 3 cups popcorn, or  cup cooked rice, pasta, or cereal. Meats and other proteins Aim to eat 5-6 ounce-equivalents of protein each day. Examples of 1 ounce-equivalent of protein include 1 egg, 1/2 cup nuts or seeds, or 1 tablespoon (16 g) peanut butter. A cut of meat or fish that is the size of a deck of cards is about 3-4 ounce-equivalents.  Of the protein you eat each week, try to have at least 8 ounces come from seafood. This includes salmon, trout, herring, and anchovies. Dairy Aim to eat 3 cup-equivalents of fat-free or low-fat dairy each day. Examples of 1 cup-equivalent of dairy include 1 cup (240 mL) milk, 8 ounces (250 g) yogurt, 1 ounces (44 g) natural cheese, or 1 cup (240 mL) fortified soy milk. Fats and oils  Aim for about 5 teaspoons (21 g) per day. Choose monounsaturated fats, such as canola and olive oils, avocados, peanut butter, and most nuts, or polyunsaturated fats, such as sunflower, corn, and soybean oils, walnuts, pine nuts, sesame seeds, sunflower seeds, and flaxseed. Beverages  Aim for six 8-oz glasses of water per day. Limit coffee to three to five 8-oz cups per day.  Limit caffeinated beverages  that have added calories, such as soda and energy drinks.  Limit alcohol intake to no more than 1 drink a day for nonpregnant women and 2 drinks a day for men. One drink equals 12 oz of beer (355 mL), 5 oz of wine (148 mL), or 1 oz of hard liquor (44 mL). Seasoning and other foods  Avoid adding excess amounts  of salt to your foods. Try flavoring foods with herbs and spices instead of salt.  Avoid adding sugar to foods.  Try using oil-based dressings, sauces, and spreads instead of solid fats. This information is based on general [place] nutrition guidelines. For more information, visit BuildDNA.es. Exact amounts may vary based on your nutrition needs. Summary  A healthy eating plan may help you to maintain a healthy weight, reduce the risk of chronic diseases, and stay active throughout your life.  Plan your meals. Make sure you eat the right portions of a variety of nutrient-rich foods.  Try baking, boiling, grilling, or broiling instead of frying.  Choose healthy options in all settings, including home, work, school, restaurants, or stores. This information is not intended to replace advice given to you by your health care provider. Make sure you discuss any questions you have with your health care provider. Document Released: 02/28/2018 Document Revised: 02/28/2018 Document Reviewed: 02/28/2018 Elsevier Patient Education  2020 [employer].

## 2019-10-24 NOTE — Progress Notes (Signed)
telephone Note  I connected with Bethany Mitchell   on 10/24/19 at  3:00 PM EST by telephone and verified that I am speaking with the correct person using two identifiers.  Location patient: home Location provider:work or home office Persons participating in the virtual visit: patient, provider  I discussed the limitations of evaluation and management by telemedicine and the availability of in person appointments. The patient expressed understanding and agreed to proceed.   HPI: 1. Uncontrolled hypothyroidism dose armour thyroid increased 15 to 30 mg 09/25/19 and repeat lab [facility] 11/03/2019 pt feeling better as far a energy and denies increased bruising  2. Obesity wt 173-175 on break from adipex due to resume 12/2019  3. UTI had 08/28/2019 txed UTI resolved c/o yeast infection  4. Right upper arm sunflower tattoo with skin irritation and itching and rash outside of ink getting better saw job and given bactrim which is giving her a yeast infection and wants meds for yeast infection. Tattoo outside of it was itchy   ROS: See pertinent positives and negatives per HPI.  Past Medical History:  Diagnosis Date  . Anemia    blood transfusion 2003  . Frequent headaches   . Kidney stone   . Subclinical hypothyroidism   . Thyroid disease   . UTI (urinary tract infection)    e coli 08/28/19   . Vaginal bleeding   . Vitamin D deficiency     Past Surgical History:  Procedure Laterality Date  . ABDOMINAL HYSTERECTOMY     Partial  . FACIAL RECONSTRUCTION SURGERY  May 2003  . LAPAROSCOPIC HYSTERECTOMY  2011   11/01/10 still has cervix     Family History  Problem Relation Age of Onset  . Diabetes Father   . Thyroid disease Mother   . Depression Mother   . Anxiety disorder Mother   . Cancer Maternal Grandmother        breast cancer  . Thyroid disease Maternal Grandmother   . Breast cancer Maternal Grandmother   . 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  Cholecalciferol (D-3-5) 5000 units capsule, Take 5,000 Units by mouth daily., Disp: , Rfl:  .  phentermine (ADIPEX-P) 37.5 MG tablet, Take 1 tablet (37.5 mg total) by mouth daily before breakfast. Rx 1/2 to fill mid 12/2019, Disp: 60 tablet, Rfl: 0 .  thyroid (ARMOUR) 30 MG tablet, Take 1 tablet (30 mg total) by mouth daily., Disp: , Rfl:  .  fluconazole (DIFLUCAN) 150 MG tablet, Take 1 tablet (150 mg total) by mouth once for 1 dose. Repeat 1 dose in 3 days prn, Disp: 2 tablet, Rfl: 0 .  hydrocortisone 2.5 % cream, Apply topically 2 (two) times daily., Disp: 60 g, Rfl: 0 .  mupirocin ointment (BACTROBAN) 2 %, Apply 1 application topically 3 (three) times daily., Disp: 30 g, Rfl: 0 .  phentermine (ADIPEX-P) 37.5 MG tablet, Take 1 tablet (37.5 mg total) by mouth daily before breakfast. Rx 2/2 to fill mid 01/2020, Disp: 60 tablet, Rfl: 0  EXAM:  VITALS per patient if applicable:  GENERAL: alert, oriented, appears well and in no acute distress PSYCH/NEURO: pleasant and cooperative, no obvious depression or anxiety, speech and thought processing grossly  intact  ASSESSMENT AND PLAN:  Discussed the following assessment and plan:  Yeast vaginitis - Plan: fluconazole (DIFLUCAN) 150 MG tablet  Hypothyroidism, unspecified type - Plan: thyroid (ARMOUR) 30 MG tablet lab visit [facility] 11/03/19 and f/u 12/26/19   Obesity (BMI 35.0-39.9 without comorbidity) - Plan: phentermine (ADIPEX-P) 37.5 MG tablet, phentermine (ADIPEX-P) 37.5 MG tablet x 4 month supply to start mid 12/2019 then will need 3 month break  rec healthy diet and exercise   Dermatitis - Plan: hydrocortisone 2.5 % cream Skin sore - Plan: mupirocin ointment (BACTROBAN) 2 %  HM Flu shot declines Tdap had 02/18/11  HPV  given info  hep b immune  Pap 04/28/18 neg no HPV no further abnormal vaginal bleeding hold on OB/GYN referral, s/p parital hysterectomyand no cycles    had labs 08/21/19 reviewed   Mammogram age 54 yo    Continue healthy diet choices and exercisecongratulated on wt loss  -we discussed possible serious and likely etiologies, options for evaluation and workup, limitations of telemedicine visit vs in person visit, treatment, treatment risks and precautions. Pt prefers to treat via telemedicine empirically rather then risking or undertaking an in person visit at this moment. Patient agrees to seek prompt in person care if worsening, new symptoms arise, or if is not improving with treatment.   I discussed the assessment and treatment plan with the patient. The patient was provided an opportunity to ask questions and all were answered. The patient agreed with the plan and demonstrated an understanding of the instructions.   The patient was advised to call back or seek an in-person evaluation if the symptoms worsen or if the condition fails to improve as anticipated.  Time spent 15 minutes  Delorise Jackson, MD

## 2019-11-03 DIAGNOSIS — E063 Autoimmune thyroiditis: Secondary | ICD-10-CM | POA: Diagnosis not present

## 2019-12-26 DIAGNOSIS — E063 Autoimmune thyroiditis: Secondary | ICD-10-CM | POA: Diagnosis not present

## 2019-12-26 DIAGNOSIS — E669 Obesity, unspecified: Secondary | ICD-10-CM | POA: Diagnosis not present

## 2020-02-06 DIAGNOSIS — E063 Autoimmune thyroiditis: Secondary | ICD-10-CM | POA: Diagnosis not present

## 2020-03-26 DIAGNOSIS — E669 Obesity, unspecified: Secondary | ICD-10-CM | POA: Diagnosis not present

## 2020-03-26 DIAGNOSIS — E063 Autoimmune thyroiditis: Secondary | ICD-10-CM | POA: Diagnosis not present

## 2020-05-13 DIAGNOSIS — E063 Autoimmune thyroiditis: Secondary | ICD-10-CM | POA: Diagnosis not present

## 2020-06-10 DIAGNOSIS — M9903 Segmental and somatic dysfunction of lumbar region: Secondary | ICD-10-CM | POA: Diagnosis not present

## 2020-06-10 DIAGNOSIS — M955 Acquired deformity of pelvis: Secondary | ICD-10-CM | POA: Diagnosis not present

## 2020-06-10 DIAGNOSIS — M545 Low back pain: Secondary | ICD-10-CM | POA: Diagnosis not present

## 2020-06-10 DIAGNOSIS — M9905 Segmental and somatic dysfunction of pelvic region: Secondary | ICD-10-CM | POA: Diagnosis not present

## 2020-06-13 DIAGNOSIS — M955 Acquired deformity of pelvis: Secondary | ICD-10-CM | POA: Diagnosis not present

## 2020-06-13 DIAGNOSIS — M545 Low back pain: Secondary | ICD-10-CM | POA: Diagnosis not present

## 2020-06-13 DIAGNOSIS — M9905 Segmental and somatic dysfunction of pelvic region: Secondary | ICD-10-CM | POA: Diagnosis not present

## 2020-06-13 DIAGNOSIS — M9903 Segmental and somatic dysfunction of lumbar region: Secondary | ICD-10-CM | POA: Diagnosis not present

## 2020-07-09 DIAGNOSIS — E063 Autoimmune thyroiditis: Secondary | ICD-10-CM | POA: Diagnosis not present

## 2020-07-09 DIAGNOSIS — E669 Obesity, unspecified: Secondary | ICD-10-CM | POA: Diagnosis not present

## 2020-08-23 ENCOUNTER — Ambulatory Visit: Payer: BC Managed Care – PPO | Admitting: Internal Medicine

## 2021-03-25 ENCOUNTER — Other Ambulatory Visit: Payer: Self-pay | Admitting: Internal Medicine

## 2021-03-25 DIAGNOSIS — L309 Dermatitis, unspecified: Secondary | ICD-10-CM

## 2021-06-04 IMAGING — CR DG CHEST 2V
1 series · 2 of 2 positions shown · non-contrast
Comparison: 12/10/2008

CLINICAL DATA: Fatigue

EXAM:
CHEST - 2 VIEW

[Series 1: dg chest 2 view · 0.14mm/px · 2 of 2 slices shown]
[im 1/2]
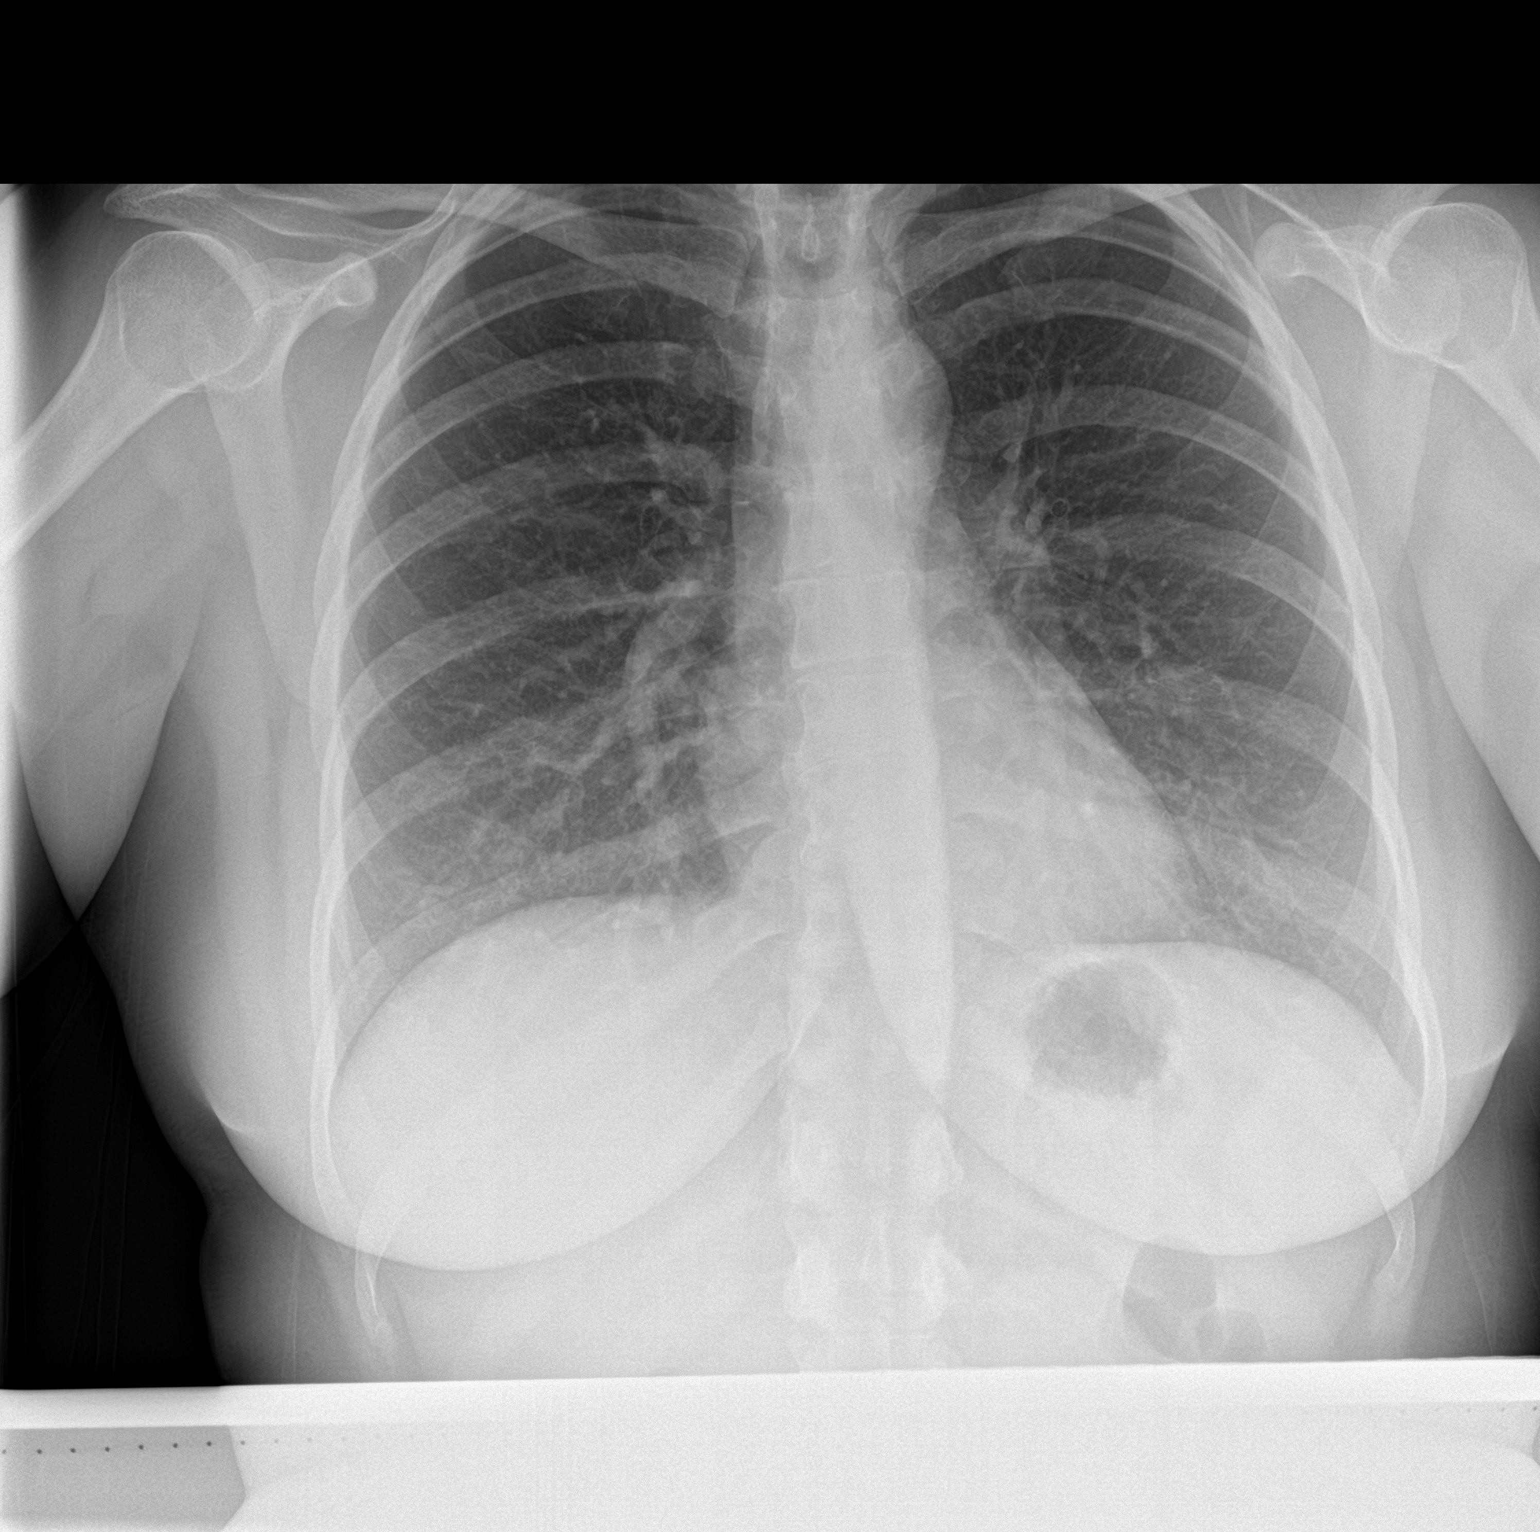
[im 2/2]
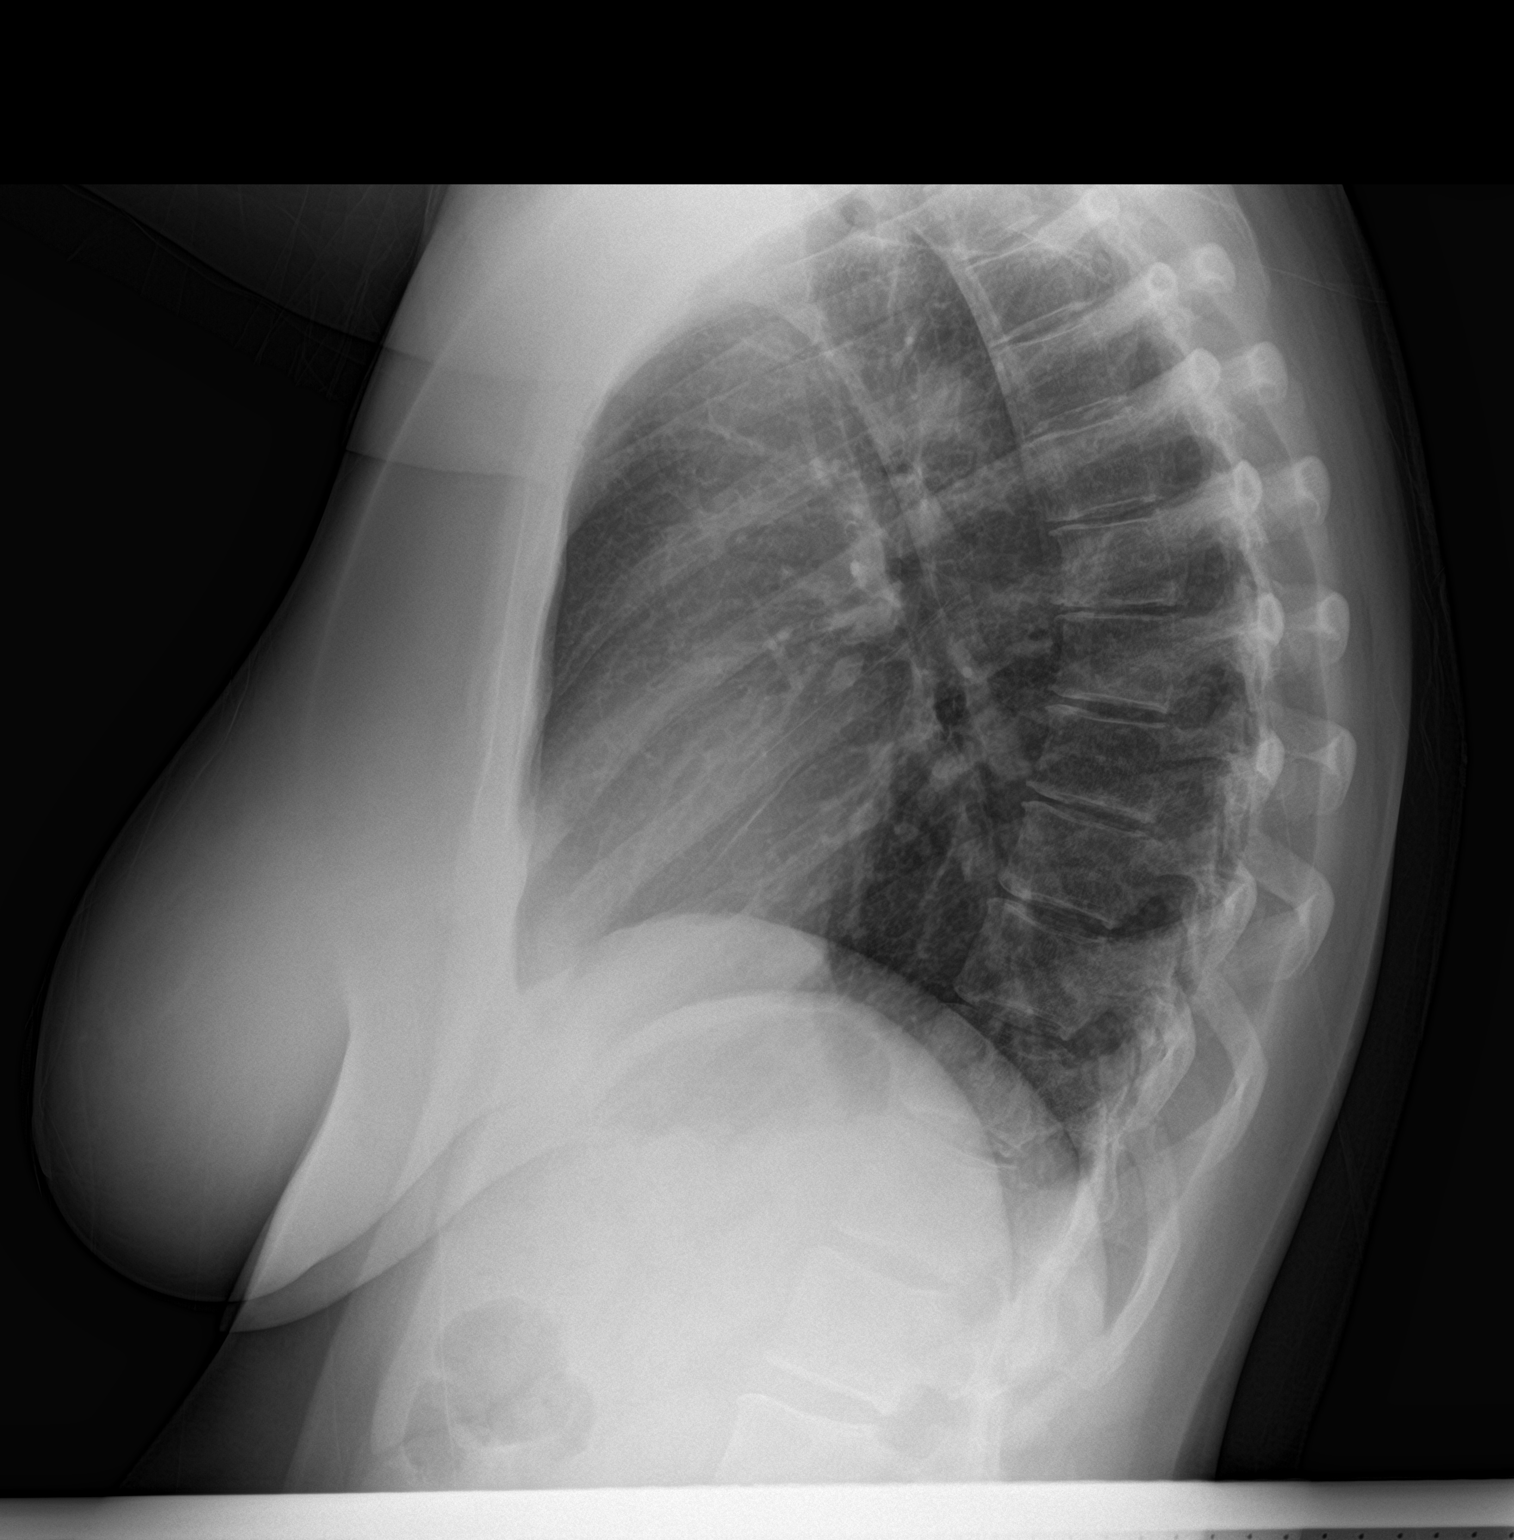

[2 of 2 positions shown; findings below may reference images not displayed]

FINDINGS: Heart and mediastinal contours are within normal limits. No focal
opacities or effusions. No acute bony abnormality.
IMPRESSION: No active cardiopulmonary disease.

## 2021-08-19 ENCOUNTER — Telehealth: Payer: Self-pay

## 2021-08-19 NOTE — Telephone Encounter (Signed)
Received a signed release of information request to [facility] MD. Copy of form has been placed to scan and original has been sent to Medical Records through interoffice communication.

## 2024-01-17 ENCOUNTER — Encounter: Payer: Self-pay | Admitting: Internal Medicine

## 2024-01-17 ENCOUNTER — Ambulatory Visit: Payer: 59 | Admitting: Internal Medicine

## 2024-01-17 VITALS — BP 114/72 | Ht 62.0 in | Wt 196.8 lb

## 2024-01-17 DIAGNOSIS — R739 Hyperglycemia, unspecified: Secondary | ICD-10-CM | POA: Diagnosis not present

## 2024-01-17 DIAGNOSIS — Z1231 Encounter for screening mammogram for malignant neoplasm of breast: Secondary | ICD-10-CM

## 2024-01-17 DIAGNOSIS — Z136 Encounter for screening for cardiovascular disorders: Secondary | ICD-10-CM

## 2024-01-17 DIAGNOSIS — E038 Other specified hypothyroidism: Secondary | ICD-10-CM | POA: Diagnosis not present

## 2024-01-17 DIAGNOSIS — G43C1 Periodic headache syndromes in child or adult, intractable: Secondary | ICD-10-CM

## 2024-01-17 DIAGNOSIS — F324 Major depressive disorder, single episode, in partial remission: Secondary | ICD-10-CM

## 2024-01-17 MED ORDER — SERTRALINE HCL 25 MG PO TABS: 25.0000 mg | ORAL_TABLET | Freq: Every day | ORAL | 5 refills | Status: DC

## 2024-01-17 MED ORDER — CONTRAVE 8-90 MG PO TB12: ORAL_TABLET | ORAL | Status: DC

## 2024-01-17 NOTE — Assessment & Plan Note (Signed)
TSH and free T4 today We we will adjust Armour Thyroid if needed based on labs

## 2024-01-17 NOTE — Progress Notes (Signed)
Subjective:    Patient ID: Bethany Mitchell, female    DOB: Apr 21, 1981, 43 y.o.   MRN: [id number]  HPI  Patient presents to clinic today to establish care and for management of the conditions listed below.  Migraines: These occur rarely.  Triggered by seasonal changes.  She takes antihistamines, excedrin migraine as needed with good relief of symptoms.  She does not follow with neurology.  Hypothyroidism: She denies any issues on her current dose of armour thyroid.  She does not follow with endocrinology.  Anxiety and depression: She is not currently taking any medications for this currently but would like to get restarted on this.  She is not currently seeing a therapist but she has in the past.  She denies anxiety, SI/HI.  Review of Systems   Past Medical History:  Diagnosis Date   Anemia    blood transfusion 2003   Frequent headaches    Kidney stone    Subclinical hypothyroidism    Thyroid disease    UTI (urinary tract infection)    e coli 08/28/19    Vaginal bleeding    Vitamin D deficiency     Current Outpatient Medications  Medication Sig Dispense Refill   Cholecalciferol (D-3-5) 5000 units capsule Take 5,000 Units by mouth daily.     hydrocortisone 2.5 % cream APPLY TO AFFECTED AREA TWICE A DAY 56.7 g 1   mupirocin ointment (BACTROBAN) 2 % Apply 1 application topically 3 (three) times daily. 30 g 0   phentermine (ADIPEX-P) 37.5 MG tablet Take 1 tablet (37.5 mg total) by mouth daily before breakfast. Rx 2/2 to fill mid 01/2020 60 tablet 0   phentermine (ADIPEX-P) 37.5 MG tablet Take 1 tablet (37.5 mg total) by mouth daily before breakfast. Rx 1/2 to fill mid 12/2019 60 tablet 0   thyroid (ARMOUR) 30 MG tablet Take 1 tablet (30 mg total) by mouth daily.     No current facility-administered medications for this visit.    No Known Allergies  Family History  Problem Relation Age of Onset   Diabetes Father    Thyroid disease Mother    Depression Mother    Anxiety  disorder Mother    Cancer Maternal Grandmother        breast cancer   Thyroid disease Maternal Grandmother    Breast cancer Maternal Grandmother    Diabetes Maternal Grandfather     Social History   Socioeconomic History   Marital status: Married    Spouse name: Not on file   Number of children: Not on file   Years of education: Not on file   Highest education level: Not on file  Occupational History   Not on file  Tobacco Use   Smoking status: Never   Smokeless tobacco: Never  Substance and Sexual Activity   Alcohol use: Yes    Alcohol/week: 0.0 standard drinks of alcohol    Comment: occasionally   Drug use: No   Sexual activity: Not Currently  Other Topics Concern   Not on file  Social History Narrative   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  Denies fever, malaise, fatigue, or abrupt weight changes.  HEENT: Denies eye pain, eye redness, ear pain, ringing in the ears, wax buildup, runny nose, nasal congestion, bloody nose, or sore throat. Respiratory: Denies difficulty breathing, shortness of breath, cough or sputum production.   Cardiovascular: Denies chest pain, chest tightness, palpitations or swelling in the hands or feet.  Gastrointestinal: Denies abdominal pain, bloating, constipation, diarrhea or blood in the stool.  GU: Denies urgency, frequency, pain with urination, burning sensation, blood in urine, odor or discharge. Musculoskeletal: Denies decrease in range of  motion, difficulty with gait, muscle pain or joint pain and swelling.  Skin: Denies redness, rashes, lesions or ulcercations.  Neurological: Denies dizziness, difficulty with memory, difficulty with speech or problems with balance and coordination.  Psych: Pt has a history of depression. Denies anxiety, SI/HI.  No other specific complaints in a complete review of systems (except as listed in HPI above).      Objective:   Physical Exam  BP 114/72 (BP Location: Left Arm, Patient Position: Sitting, Cuff Size: Large)   Ht 5\' 2"  (1.575 m)   Wt 196 lb 12.8 oz (89.3 kg)   BMI 36.00 kg/m   Wt Readings from Last 3 Encounters:  10/24/19 173 lb (78.5 kg)  08/03/19 175 lb (79.4 kg)  12/16/18 191 lb 3.2 oz (86.7 kg)    General: Appears her stated age, obese, in NAD. HEENT: Head: normal shape and size; Eyes: sclera white, no icterus, conjunctiva pink, PERRLA and EOMs intact;  Neck:  Neck supple, trachea midline. No masses, lumps or thyromegaly present.  Cardiovascular: Normal rate and rhythm. S1,S2 noted.  No murmur, rubs or gallops noted. No JVD or BLE edema.  Pulmonary/Chest: Normal effort and positive vesicular breath sounds. No respiratory distress. No wheezes, rales or ronchi noted.  Neurological: Alert and oriented. Coordination normal.  Psychiatric: Mood and affect mildly flat. Behavior is normal. Judgment and thought content normal.    BMET    Component Value Date/Time   NA 139 08/21/2019 0836   NA 140 04/02/2014 1340   NA 140 03/29/2014 1218   K 4.1 08/21/2019 0836   K 3.6 03/29/2014 1218   CL 105 08/21/2019 0836   CL 104 03/29/2014 1218   CO2 27 08/21/2019 0836   CO2 30 03/29/2014 1218   GLUCOSE 91 08/21/2019 0836   GLUCOSE 84 03/29/2014 1218   BUN 14 08/21/2019 0836   BUN 15 04/02/2014 1340   BUN 14 03/29/2014 1218   CREATININE 0.77 08/21/2019 0836   CREATININE 0.54 (L) 03/29/2014 1218   CALCIUM 9.3 08/21/2019 0836   CALCIUM 9.2 03/29/2014 1218   GFRNONAA >60  03/29/2014 1218   GFRAA >60 03/29/2014 1218    Lipid Panel     Component Value Date/Time   CHOL 126 08/21/2019 0836   TRIG 60.0 08/21/2019 0836   HDL 44.30 08/21/2019 0836   CHOLHDL 3 08/21/2019 0836   VLDL 12.0 08/21/2019 0836   LDLCALC 70 08/21/2019 0836    CBC    Component Value Date/Time   WBC 5.6 08/21/2019 0836   RBC 4.81 08/21/2019 0836   HGB 14.0 08/21/2019 0836   HGB 14.1 03/29/2014 1218   HCT 41.3 08/21/2019 0836   HCT 43.2 03/29/2014 1218   PLT 217.0  08/21/2019 0836   PLT 216 03/29/2014 1218   MCV 85.9 08/21/2019 0836   MCV 85 03/29/2014 1218   MCH 27.9 03/29/2014 1218   MCHC 33.9 08/21/2019 0836   RDW 12.8 08/21/2019 0836   RDW 13.3 03/29/2014 1218   LYMPHSABS 1.8 08/21/2019 0836   MONOABS 0.3 08/21/2019 0836   EOSABS 0.0 08/21/2019 0836   BASOSABS 0.0 08/21/2019 0836    Hgb A1C No results found for: "HGBA1C"          Assessment & Plan:    RTC in 6 months for your annual exam Nicki Reaper, NP

## 2024-01-17 NOTE — Addendum Note (Signed)
Addended by: Luanna Cole D on: 01/17/2024 11:07 AM   Modules accepted: Orders

## 2024-01-17 NOTE — Assessment & Plan Note (Signed)
Continue antihistamines and Excedrin great as needed

## 2024-01-17 NOTE — Patient Instructions (Signed)

## 2024-01-17 NOTE — Assessment & Plan Note (Signed)
Will trial sertraline 25 mg daily Support offered

## 2024-01-18 ENCOUNTER — Encounter: Payer: Self-pay | Admitting: Internal Medicine

## 2024-01-18 LAB — CBC
HCT: 42.8 % (ref 35.0–45.0)
Hemoglobin: 13.8 g/dL (ref 11.7–15.5)
MCH: 28.3 pg (ref 27.0–33.0)
MCHC: 32.2 g/dL (ref 32.0–36.0)
MCV: 87.7 fL (ref 80.0–100.0)
MPV: 9.2 fL (ref 7.5–12.5)
Platelets: 224 10*3/uL (ref 140–400)
RBC: 4.88 10*6/uL (ref 3.80–5.10)
RDW: 12.4 % (ref 11.0–15.0)
WBC: 6.1 10*3/uL (ref 3.8–10.8)

## 2024-01-18 LAB — LIPID PANEL
Cholesterol: 160 mg/dL (ref <200)
HDL: 56 mg/dL (ref >=50)
LDL Cholesterol (Calc): 88 mg/dL
Non-HDL Cholesterol (Calc): 104 mg/dL (ref <130)
Total CHOL/HDL Ratio: 2.9 (calc) (ref <5.0)
Triglycerides: 74 mg/dL (ref <150)

## 2024-01-18 LAB — COMPLETE METABOLIC PANEL WITH GFR
AG Ratio: 1.7 (calc) (ref 1.0–2.5)
ALT: 16 U/L (ref 6–29)
AST: 14 U/L (ref 10–30)
Albumin: 4.3 g/dL (ref 3.6–5.1)
Alkaline phosphatase (APISO): 92 U/L (ref 31–125)
BUN: 14 mg/dL (ref 7–25)
CO2: 29 mmol/L (ref 20–32)
Calcium: 9.1 mg/dL (ref 8.6–10.2)
Chloride: 104 mmol/L (ref 98–110)
Creat: 0.66 mg/dL (ref 0.50–0.99)
Globulin: 2.6 g/dL (ref 1.9–3.7)
Glucose, Bld: 87 mg/dL (ref 65–99)
Potassium: 3.8 mmol/L (ref 3.5–5.3)
Sodium: 139 mmol/L (ref 135–146)
Total Bilirubin: 0.5 mg/dL (ref 0.2–1.2)
Total Protein: 6.9 g/dL (ref 6.1–8.1)
eGFR: 114 mL/min/{1.73_m2} (ref >=60)

## 2024-01-18 LAB — HEMOGLOBIN A1C
Hgb A1c MFr Bld: 5 %{Hb} (ref <5.7)
Mean Plasma Glucose: 97 mg/dL
eAG (mmol/L): 5.4 mmol/L

## 2024-01-18 LAB — T4, FREE: Free T4: 0.8 ng/dL (ref 0.8–1.8)

## 2024-01-18 LAB — TSH: TSH: 2.98 m[IU]/L

## 2024-02-01 ENCOUNTER — Encounter: Payer: Self-pay | Admitting: Internal Medicine

## 2024-02-07 MED ORDER — CONTRAVE 8-90 MG PO TB12: 2.0000 | ORAL_TABLET | Freq: Two times a day (BID) | ORAL | 2 refills | Status: DC

## 2024-02-07 NOTE — Addendum Note (Signed)
 Addended by: Lorre Munroe on: 02/07/2024 10:40 AM   Modules accepted: Orders

## 2024-02-16 ENCOUNTER — Encounter: Payer: Self-pay | Admitting: Internal Medicine

## 2024-02-17 ENCOUNTER — Telehealth: Admitting: Internal Medicine

## 2024-02-17 ENCOUNTER — Encounter: Payer: Self-pay | Admitting: Internal Medicine

## 2024-02-17 DIAGNOSIS — N760 Acute vaginitis: Secondary | ICD-10-CM

## 2024-02-17 DIAGNOSIS — B9689 Other specified bacterial agents as the cause of diseases classified elsewhere: Secondary | ICD-10-CM | POA: Diagnosis not present

## 2024-02-17 DIAGNOSIS — N898 Other specified noninflammatory disorders of vagina: Secondary | ICD-10-CM

## 2024-02-17 MED ORDER — METRONIDAZOLE 500 MG PO TABS: 500.0000 mg | ORAL_TABLET | Freq: Two times a day (BID) | ORAL | 0 refills | Status: DC

## 2024-02-17 NOTE — Patient Instructions (Signed)
 Vaginal Infection (Bacterial Vaginosis): What to Know  Bacterial vaginosis is an infection of the vagina. It happens when the balance of normal germs (bacteria) in the vagina changes. If you don't get treated, it can make it easier for you to get other infections from sex. These are called sexually transmitted infections (STIs). If you're pregnant, you need to get treated right away. This infection can cause a baby to be born early or at a low birth weight. What are the causes? This infection happens when too many harmful germs grow in the vagina. You can't get this infection from toilet seats, bedsheets, swimming pools, or things that touch your vagina. What increases the risk? Having sex with a new person or more than one person. Having sex without protection. Douching. Having an intrauterine device (IUD). Smoking. Using drugs or drinking alcohol. These can lead you to do risky things. Taking certain antibiotics. Being pregnant. What are the signs or symptoms? Some females have no symptoms. Symptoms may include: A gray or white discharge from your vagina. It can be watery or foamy. A fishy smell. This can happen after sex or during your menstrual period. Itching in and around your vagina. Burning or pain when you pee. How is this treated? This infection is treated with antibiotics. These may be given to you as: A pill. A cream for your vagina. A medicine that you put into your vagina (suppository). If the infection comes back, you may need more antibiotics. Follow these instructions at home: Medicines Take your medicines as told. Take or use your antibiotics as told. Do not stop using them even if you start to feel better. General instructions If the person you have sex with is a female, tell her that you have this infection. She will need to follow up with her doctor. Female partners don't need to be treated. Do not have sex until you finish treatment. Drink more fluids as  told. Keep your vagina and butt clean. Wash these areas with warm water each day. Wipe from front to back after you poop. If you're breastfeeding a baby, talk to your doctor if you should keep doing so during treatment. How is this prevented? Self-care Do not douche. Do not use deodorant sprays on your vagina. Wear cotton underwear. Do not wear tight pants and pantyhose, especially in the summer. Safe sex Use condoms the correct way and every time you have sex. Use dental dams to protect yourself during oral sex. Limit how many people you have sex with. Get tested for STIs. The person you have sex with should also get tested. Drugs and alcohol Do not smoke, vape, or use nicotine or tobacco. Do not use drugs. Limit the amount of alcohol you drink because it can lead you to do risky things. Where to find more information To learn more: Go to TonerPromos.no. Click Health Topics A-Z. Type "bacterial vaginosis" in the search bar. American Sexual Health Association (ASHA): ashasexualhealth.org U.S. Department of Health and [employer], Office on Women's Health: TravelLesson.ca Contact a doctor if: Your symptoms don't get better, even after treatment. You have more discharge or pain when you pee. You have a fever or chills. You have pain in your belly or in the area between your hips. You have pain during sex. You bleed from your vagina between menstrual periods. This information is not intended to replace advice given to you by your health care provider. Make sure you discuss any questions you have with your health care provider. Document  Revised: 05/05/2023 Document Reviewed: 05/05/2023 Elsevier Patient Education  2024 [employer].

## 2024-02-17 NOTE — Progress Notes (Signed)
 Virtual Visit via Video Note  I connected with Bethany Mitchell on 02/17/24 at  4:00 PM EDT by a video enabled telemedicine application and verified that I am speaking with the correct person using two identifiers.  Location: Patient: Work Provider: Engineer, structural in this video call: Bethany Reaper, NP-C and Bethany Mitchell   I discussed the limitations of evaluation and management by telemedicine and the availability of in person appointments. The patient expressed understanding and agreed to proceed.  History of Present Illness:  Discussed the use of AI scribe software for clinical note transcription with the patient, who gave verbal consent to proceed.   Bethany Mitchell is a 43 year old female who presents with concerns of a possible yeast infection.  She experiences a yellowy white discharge with a fishy odor, which she finds confusing as there is no associated itching. No pelvic or low back pain, which she associates with previous yeast infections.  She is planning to go on a cruise next Sunday and prefers oral medication over vaginal suppositories due to discomfort with creams. She has not had a yeast infection in years and is puzzled by the current symptoms, particularly the odor.  She inquires about the safety of intercourse.          Past Medical History:  Diagnosis Date   Anemia    blood transfusion 2003   Frequent headaches    Kidney stone    Subclinical hypothyroidism    Thyroid disease    UTI (urinary tract infection)    e coli 08/28/19    Vaginal bleeding    Vitamin D deficiency     Current Outpatient Medications  Medication Sig Dispense Refill   ARMOUR THYROID 60 MG tablet Take 60 mg by mouth every morning.     Naltrexone-buPROPion HCl ER (CONTRAVE) 8-90 MG TB12 Take 2 tablets by mouth 2 (two) times daily. Start 1 tablet every morning for 7 days, then 1 tablet twice daily for 7 days, then 2 tablets every morning and one every evening 120 tablet 2    sertraline (ZOLOFT) 25 MG tablet Take 1 tablet (25 mg total) by mouth daily. 30 tablet 5   No current facility-administered medications for this visit.    No Known Allergies  Family History  Problem Relation Age of Onset   Diabetes Father    Thyroid disease Mother    Depression Mother    Anxiety disorder Mother    Cancer Maternal Grandmother        breast cancer   Thyroid disease Maternal Grandmother    Breast cancer Maternal Grandmother    Diabetes Maternal Grandfather     Social History   Socioeconomic History   Marital status: Married    Spouse name: Not on file   Number of children: Not on file   Years of education: Not on file   Highest education level: Not on file  Occupational History   Not on file  Tobacco Use   Smoking status: Never   Smokeless tobacco: Never  Substance and Sexual Activity   Alcohol use: Yes    Alcohol/week: 0.0 standard drinks of alcohol    Comment: occasionally   Drug use: No   Sexual activity: Not Currently  Other Topics Concern   Not on file  Social History Narrative   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  Respiratory: Denies difficulty breathing, shortness of breath, cough or sputum production.   Cardiovascular: Denies chest pain, chest tightness, palpitations or swelling in the hands or feet.  Gastrointestinal: Denies abdominal pain, bloating, constipation, diarrhea or blood in the stool.  GU: Pt reports vaginal  discharge and odor. Denies urgency, frequency, pain with urination, burning sensation, blood in urine.  No other specific complaints in a complete review of systems (except as listed in HPI above).  Observations/Objective:   Wt Readings from Last 3 Encounters:  01/17/24 196 lb 12.8 oz (89.3 kg)  10/24/19 173 lb (78.5 kg)  08/03/19 175 lb (79.4 kg)    General: Appears her stated age, well developed, well nourished in NAD. Pulmonary/Chest: Normal effort. No respiratory distress.  Neurological: Alert and oriented.   BMET    Component Value Date/Time   NA 139 01/17/2024 1028   NA 140 04/02/2014 1340   NA 140 03/29/2014 1218   K 3.8 01/17/2024 1028   K 3.6 03/29/2014 1218   CL 104 01/17/2024 1028   CL 104 03/29/2014 1218   CO2 29 01/17/2024 1028   CO2 30 03/29/2014 1218   GLUCOSE 87 01/17/2024 1028   GLUCOSE 84 03/29/2014 1218   BUN 14 01/17/2024 1028   BUN 15 04/02/2014 1340   BUN 14 03/29/2014 1218   CREATININE 0.66 01/17/2024 1028   CALCIUM 9.1 01/17/2024 1028   CALCIUM 9.2 03/29/2014 1218   GFRNONAA >60 03/29/2014 1218   GFRAA >60 03/29/2014 1218    Lipid Panel     Component Value Date/Time   CHOL 160 01/17/2024 1028   TRIG 74 01/17/2024 1028   HDL 56 01/17/2024 1028   CHOLHDL 2.9 01/17/2024 1028   VLDL 12.0 08/21/2019 0836   LDLCALC 88 01/17/2024 1028    CBC    Component Value Date/Time   WBC 6.1 01/17/2024 1028   RBC 4.88 01/17/2024 1028   HGB 13.8 01/17/2024 1028   HGB 14.1 03/29/2014 1218   HCT 42.8 01/17/2024 1028   HCT 43.2 03/29/2014 1218   PLT 224 01/17/2024 1028   PLT 216 03/29/2014 1218   MCV 87.7 01/17/2024 1028   MCV 85 03/29/2014 1218   MCH 28.3 01/17/2024 1028   MCHC 32.2 01/17/2024 1028   RDW 12.4 01/17/2024 1028   RDW 13.3 03/29/2014 1218   LYMPHSABS 1.8 08/21/2019 0836   MONOABS 0.3 08/21/2019 0836   EOSABS 0.0 08/21/2019 0836   BASOSABS 0.0 08/21/2019 0836    Hgb A1C Lab Results  Component Value Date   HGBA1C 5.0  01/17/2024       Assessment and Plan:  Assessment and Plan    Vaginal discharge and odor Symptoms indicate bacterial vaginosis, likely due to bacterial overgrowth. Prefers oral medication. Informed about alcohol interaction with Flagyl. - Prescribe Flagyl 500 mg orally twice a day for 7 days. - Advise against alcohol consumption during Flagyl course. - Instruct to return for swab test if symptoms persist by next Friday. - Advise  on safe intercourse; not sexually transmitted. - Recommend using sensitive soap and water only.       RTC in 5 months for followup chronic conditions  Follow Up Instructions:    I discussed the assessment and treatment plan with the patient. The patient was provided an opportunity to ask questions and all were answered. The patient agreed with the plan and demonstrated an understanding of the instructions.   The patient was advised to call back or seek an in-person evaluation if the symptoms worsen or if the condition fails to improve as anticipated.   Bethany Reaper, NP

## 2024-05-04 ENCOUNTER — Encounter: Payer: Self-pay | Admitting: Internal Medicine

## 2024-05-04 ENCOUNTER — Ambulatory Visit: Admitting: Internal Medicine

## 2024-05-04 VITALS — BP 122/74 | Ht 62.0 in | Wt 198.4 lb

## 2024-05-04 DIAGNOSIS — K582 Mixed irritable bowel syndrome: Secondary | ICD-10-CM | POA: Diagnosis not present

## 2024-05-04 DIAGNOSIS — E66812 Obesity, class 2: Secondary | ICD-10-CM | POA: Diagnosis not present

## 2024-05-04 DIAGNOSIS — T887XXA Unspecified adverse effect of drug or medicament, initial encounter: Secondary | ICD-10-CM

## 2024-05-04 DIAGNOSIS — G43C1 Periodic headache syndromes in child or adult, intractable: Secondary | ICD-10-CM | POA: Diagnosis not present

## 2024-05-04 DIAGNOSIS — K219 Gastro-esophageal reflux disease without esophagitis: Secondary | ICD-10-CM | POA: Insufficient documentation

## 2024-05-04 DIAGNOSIS — Z6836 Body mass index (BMI) 36.0-36.9, adult: Secondary | ICD-10-CM

## 2024-05-04 DIAGNOSIS — F324 Major depressive disorder, single episode, in partial remission: Secondary | ICD-10-CM

## 2024-05-04 MED ORDER — PANTOPRAZOLE SODIUM 40 MG PO TBEC: 40.0000 mg | DELAYED_RELEASE_TABLET | Freq: Every day | ORAL | 0 refills | Status: DC

## 2024-05-04 MED ORDER — PHENTERMINE HCL 37.5 MG PO CAPS
37.5000 mg | ORAL_CAPSULE | ORAL | 0 refills | Status: DC
Start: 2024-05-04 — End: 2024-06-13

## 2024-05-04 NOTE — Patient Instructions (Signed)
 GERD in Adults: Diet Changes When you have gastroesophageal reflux disease (GERD), you may need to make changes to your diet. Choosing the right foods can help with your symptoms. Think about working with an expert in healthy eating called a dietitian. They can help you make healthy food choices. What are tips for following this plan? Reading food labels Look for foods that are low in saturated fat. Foods that may help with your symptoms include: Foods with less than 5% of daily value (DV) of fat. Foods with 0 grams of trans fat. Cooking [employer] in ways that don't use a lot of fat. These ways include: Baking. Steaming. Grilling. Broiling. To add flavor, try to use herbs that are low in spice and acidity. Avoid frying your food. Meal planning  Eat small meals often rather than eating 3 large meals each day. Eat your meals slowly in a place where you feel relaxed. If told by your health care provider, avoid: Foods that cause symptoms. Keep a food diary to keep track of foods that cause symptoms. Alcohol. Drinking a lot of liquid with meals. General instructions For 2-3 hours after you eat, avoid: Bending over. Exercise. Lying down. Chew sugar-free gum after meals. What foods should I eat? Eat a healthy diet. Try to include: Foods with high amounts of fiber. These include: Fruits and vegetables. Whole grains and beans. Low-fat dairy products. Lean meats, fish, and poultry. Egg whites. Foods that cause symptoms in someone else may not cause symptoms for you. Work with your provider to find foods that are safe for you. The items listed above may not be all the foods and drinks you can have. Talk with a dietitian to learn more. The items listed above may not be a complete list of foods and beverages you can eat and drink. Contact a dietitian for more information. What foods should I avoid? Limiting some of these foods may help with your symptoms. Each person is different.  Talk with a dietitian or your provider to help you find the exact foods to avoid. Some of the foods to avoid may include: Fruits Fruits with a lot of acid in them. These may include citrus fruits, such as oranges, grapefruit, pineapple, and lemons. Vegetables Deep-fried vegetables, such as [place] fries. Vegetables, sauces, or toppings made with added fat and vegetables with acid in them. These may include tomatoes and tomato products, chili peppers, onions, garlic, and horseradish. Grains Pastries or quick breads with added fat. Meats and other proteins High-fat meats, such as fatty beef or pork, hot dogs, ribs, ham, sausage, salami, and bacon. Fried meat or protein, such as fried fish and fried chicken. Egg yolks. Fats and oils Butter. Margarine. Shortening. Ghee. Drinks Coffee and other drinks with caffeine in them. Fizzy and sugary drinks, such as soda and energy drinks. Fruit juice made with acidic fruits, such as orange or grapefruit. Tomato juice. Sweets and desserts Chocolate and cocoa. Donuts. Seasonings and condiments Mint, such as peppermint and spearmint. Condiments, herbs, or seasonings that cause symptoms. These may include curry, hot sauce, or vinegar-based salad dressings. The items listed above may not be all the foods and drinks you should avoid. Talk with a dietitian to learn more. Questions to ask your health care provider Changes to your diet and everyday life are often the first steps taken to manage symptoms of GERD. If these changes don't help, talk with your provider about taking medicines. Where to find more information [employer] Gastrointestinal [employer]:  aboutgerd.org This information is not intended to replace advice given to you by your health care provider. Make sure you discuss any questions you have with your health care provider. Document Revised: 09/28/2023 Document Reviewed: 04/14/2023 Elsevier Patient Education  2024 [employer].

## 2024-05-04 NOTE — Progress Notes (Signed)
 Subjective:    Patient ID: Bethany Mitchell, female    DOB: 1981-03-29, 43 y.o.   MRN: [id number]  HPI  Discussed the use of AI scribe software for clinical note transcription with the patient, who gave verbal consent to proceed.  Bethany Mitchell is a 43 year old female who presents with headaches and weight management concerns.  She experiences severe headaches, described as migraines, which she associates with taking sertraline . These headaches are significant enough to cause her to leave work, leading her to discontinue the medication. She does not feel the need to start a different medication for her mood at this time.  She is concerned about her weight management. She has been on contrave  since February, but her weight has not decreased significantly, remaining around 198.4 pounds with a BMI of 36.29. She finds the medication expensive and not effective. She has previously used phentermine  and experienced weight loss with it.  She experiences a burning sensation in her upper abdomen, particularly after consuming high-fat meals. The sensation is described as a burning in the stomach area, beneath the breastbone, without typical reflux symptoms. She has a history of similar symptoms, previously investigated with an endoscopy and ultrasound in 2015, which showed no gallstones. She sometimes induces vomiting to relieve the discomfort.  She mentions experiencing bowel irregularities, with frequent bowel movements typically two to three times a day, but occasionally experiencing constipation about once a month, which she associates with her menstrual cycle, despite not having periods anymore.       Review of Systems   Past Medical History:  Diagnosis Date   Anemia    blood transfusion 2003   Frequent headaches    Kidney stone    Subclinical hypothyroidism    Thyroid  disease    UTI (urinary tract infection)    e coli 08/28/19    Vaginal bleeding    Vitamin D  deficiency     Current  Outpatient Medications  Medication Sig Dispense Refill   ARMOUR THYROID  60 MG tablet Take 60 mg by mouth every morning.     metroNIDAZOLE  (FLAGYL ) 500 MG tablet Take 1 tablet (500 mg total) by mouth 2 (two) times daily. Do not drink alcohol while taking this medicine. 14 tablet 0   Naltrexone-buPROPion HCl ER (CONTRAVE ) 8-90 MG TB12 Take 2 tablets by mouth 2 (two) times daily. Start 1 tablet every morning for 7 days, then 1 tablet twice daily for 7 days, then 2 tablets every morning and one every evening 120 tablet 2   sertraline  (ZOLOFT ) 25 MG tablet Take 1 tablet (25 mg total) by mouth daily. 30 tablet 5   No current facility-administered medications for this visit.    No Known Allergies  Family History  Problem Relation Age of Onset   Diabetes Father    Thyroid  disease Mother    Depression Mother    Anxiety disorder Mother    Cancer Maternal Grandmother        breast cancer   Thyroid  disease Maternal Grandmother    Breast cancer Maternal Grandmother    Diabetes Maternal Grandfather     Social History   Socioeconomic History   Marital status: Married    Spouse name: Not on file   Number of children: Not on file   Years of education: Not on file   Highest education level: Some college, no degree  Occupational History   Not on file  Tobacco Use   Smoking status: Never   Smokeless tobacco: Never  Substance and Sexual Activity   Alcohol use: Yes    Alcohol/week: 0.0 standard drinks of alcohol    Comment: occasionally   Drug use: No   Sexual activity: Not Currently  Other Topics Concern   Not on file  Social History Narrative   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    Lives at home with husband and 2 girls   Pets 2 dogs and 2 fish    Left handed   Some college    Enjoys being with the family    [name]    Social Drivers of Health   Financial Resource Strain: Low Risk  (05/03/2024)   Overall Financial Resource Strain (CARDIA)     Difficulty of Paying Living Expenses: Not hard at all  Food Insecurity: No Food Insecurity (05/03/2024)   Hunger Vital Sign    Worried About Running Out of Food in the Last Year: Never true    Ran Out of Food in the Last Year: Never true  Transportation Needs: No Transportation Needs (05/03/2024)   PRAPARE - Administrator, Civil Service (Medical): No    Lack of Transportation (Non-Medical): No  Physical Activity: Insufficiently Active (05/03/2024)   Exercise Vital Sign    Days of Exercise per Week: 3 days    Minutes of Exercise per Session: 30 min  Stress: Stress Concern Present (05/03/2024)   [employer] of Occupational Health - Occupational Stress Questionnaire    Feeling of Stress : To some extent  Social Connections: Moderately Integrated (05/03/2024)   Social Connection and Isolation Panel [NHANES]    Frequency of Communication with Friends and Family: Three times a week    Frequency of Social Gatherings with Friends and Family: Twice a week    Attends Religious Services: More than 4 times per year    Active Member of [employer] or Organizations: No    Attends Engineer, structural: Not on file    Marital Status: Married  Catering manager Violence: Not on file     Constitutional: Patient reports intermittent headaches.  Denies fever, malaise, fatigue, or abrupt weight changes.  HEENT: Denies eye pain, eye redness, ear pain, ringing in the ears, wax buildup, runny nose, nasal congestion, bloody nose, or sore throat. Respiratory: Denies difficulty breathing, shortness of breath, cough or sputum production.   Cardiovascular: Denies chest pain, chest tightness, palpitations or swelling in the hands or feet.  Gastrointestinal: Pt reports alternating constipation and diarrhea, burning sensation in stomach. Denies abdominal pain, bloating, or blood in the stool.  GU: Patient reports amenorrhea.  Denies urgency, frequency, pain with urination, burning sensation, blood in  urine, odor or discharge. Musculoskeletal: Denies decrease in range of motion, difficulty with gait, muscle pain or joint pain and swelling.  Skin: Denies redness, rashes, lesions or ulcercations.  Neurological: Denies dizziness, difficulty with memory, difficulty with speech or problems with balance and coordination.  Psych: Pt has a history of depression. Denies anxiety, SI/HI.  No other specific complaints in a complete review of systems (except as listed in HPI above).      Objective:   Physical Exam  BP 122/74 (BP Location: Left Arm, Patient Position: Sitting, Cuff Size: Large)   Ht 5\' 2"  (1.575 m)   Wt 198 lb 6.4 oz (90 kg)   BMI 36.29 kg/m    Wt Readings from Last 3 Encounters:  01/17/24 196 lb 12.8 oz (89.3 kg)  10/24/19 173 lb (78.5 kg)  08/03/19 175  lb (79.4 kg)    General: Appears her stated age, obese, in NAD. HEENT: Head: normal shape and size; Eyes: sclera white, no icterus, conjunctiva pink, PERRLA and EOMs intact;  Cardiovascular: Normal rate and rhythm. S1,S2 noted.  No murmur, rubs or gallops noted. No JVD or BLE edema.  Pulmonary/Chest: Normal effort and positive vesicular breath sounds. No respiratory distress. No wheezes, rales or ronchi noted.  Abdomen: Soft and tender in the epigastric region.  Negative Murphy sign.  No distention or masses noted. Neurological: Alert and oriented. Coordination normal.  Psychiatric: Mood and affect mildly flat. Behavior is normal. Judgment and thought content normal.    BMET    Component Value Date/Time   NA 139 01/17/2024 1028   NA 140 04/02/2014 1340   NA 140 03/29/2014 1218   K 3.8 01/17/2024 1028   K 3.6 03/29/2014 1218   CL 104 01/17/2024 1028   CL 104 03/29/2014 1218   CO2 29 01/17/2024 1028   CO2 30 03/29/2014 1218   GLUCOSE 87 01/17/2024 1028   GLUCOSE 84 03/29/2014 1218   BUN 14 01/17/2024 1028   BUN 15 04/02/2014 1340   BUN 14 03/29/2014 1218   CREATININE 0.66 01/17/2024 1028   CALCIUM 9.1  01/17/2024 1028   CALCIUM 9.2 03/29/2014 1218   GFRNONAA >60 03/29/2014 1218   GFRAA >60 03/29/2014 1218    Lipid Panel     Component Value Date/Time   CHOL 160 01/17/2024 1028   TRIG 74 01/17/2024 1028   HDL 56 01/17/2024 1028   CHOLHDL 2.9 01/17/2024 1028   VLDL 12.0 08/21/2019 0836   LDLCALC 88 01/17/2024 1028    CBC    Component Value Date/Time   WBC 6.1 01/17/2024 1028   RBC 4.88 01/17/2024 1028   HGB 13.8 01/17/2024 1028   HGB 14.1 03/29/2014 1218   HCT 42.8 01/17/2024 1028   HCT 43.2 03/29/2014 1218   PLT 224 01/17/2024 1028   PLT 216 03/29/2014 1218   MCV 87.7 01/17/2024 1028   MCV 85 03/29/2014 1218   MCH 28.3 01/17/2024 1028   MCHC 32.2 01/17/2024 1028   RDW 12.4 01/17/2024 1028   RDW 13.3 03/29/2014 1218   LYMPHSABS 1.8 08/21/2019 0836   MONOABS 0.3 08/21/2019 0836   EOSABS 0.0 08/21/2019 0836   BASOSABS 0.0 08/21/2019 0836    Hgb A1C Lab Results  Component Value Date   HGBA1C 5.0 01/17/2024            Assessment & Plan:   Assessment and Plan    Headaches secondary to sertraline  Severe migraines post-sertraline  initiation led to discontinuation. She declined alternative mood medication. - Discontinue sertraline .  GERD Symptoms suggest acid overproduction. Previous tests negative for gallstones or abnormalities. Discussed potential HIDA scan if no improvement with PPI. - Prescribe pantoprazole 40 mg once daily for 3-4 weeks. - Consider HIDA scan if symptoms do not improve.  Irritable Bowel Syndrome (IBS) Monthly constipation with generally loose stools, correlating with mood fluctuations, consistent with IBS. - Monitor bowel symptoms and consider dietary modifications.  Obesity No weight loss on Contrave ; costly. Current weight 198.4 lbs, BMI 36.29. Previously successful with phentermine . Emphasized healthy diet and exercise. - Discontinue Contrave . - Prescribe phentermine  37.5 mg daily. - Advise on healthy diet and  exercise.    RTC in 2 months for your annual exam Helayne Lo, NP

## 2024-05-18 ENCOUNTER — Other Ambulatory Visit: Payer: Self-pay | Admitting: Internal Medicine

## 2024-05-19 NOTE — Telephone Encounter (Signed)
 Requested medications are due for refill today.  yes  Requested medications are on the active medications list.  yes  Last refill. 05/04/2024 #30 0 rf  Future visit scheduled.   yes  Notes to clinic.  Refill not delegated.    Requested Prescriptions  Pending Prescriptions Disp Refills   phentermine  37.5 MG capsule [Pharmacy Med Name: PHENTERMINE  37.5 MG CAPSULE] 30 capsule 0    Sig: TAKE 1 CAPSULE BY MOUTH EVERY MORNING     Not Delegated - Neurology: Anticonvulsants - Controlled - phentermine  hydrochloride Failed - 05/19/2024  4:44 PM      Failed - This refill cannot be delegated      Passed - eGFR in normal range and within 360 days    EGFR (African American)  Date Value Ref Range Status  03/29/2014 >60  Final   EGFR (Non-African Amer.)  Date Value Ref Range Status  03/29/2014 >60  Final    Comment:    eGFR values <49mL/min/1.73 m2 may be an indication of chronic kidney disease (CKD). Calculated eGFR is useful in patients with stable renal function. The eGFR calculation will not be reliable in acutely ill patients when serum creatinine is changing rapidly. It is not useful in  patients on dialysis. The eGFR calculation may not be applicable to patients at the low and high extremes of body sizes, pregnant women, and vegetarians.    GFR  Date Value Ref Range Status  08/21/2019 84.87 >60.00 mL/min Final   eGFR  Date Value Ref Range Status  01/17/2024 114 > OR = 60 mL/min/1.17m2 Final         Passed - Cr in normal range and within 360 days    Creat  Date Value Ref Range Status  01/17/2024 0.66 0.50 - 0.99 mg/dL Final         Passed - Last BP in normal range    BP Readings from Last 1 Encounters:  05/04/24 122/74         Passed - Valid encounter within last 6 months    Recent Outpatient Visits           2 weeks ago Major depressive disorder with single episode, in partial remission [facility])   [place] [facility] [place], Rankin Buzzard, NP   3  months ago Vaginal discharge   [place] [facility] [place], Rankin Buzzard, NP   4 months ago Subclinical hypothyroidism   [place] [facility] [place], Rankin Buzzard, NP       Future Appointments             In 1 month [place], Rankin Buzzard, NP [facility]            Passed - Weight completed in the last 3 months    Wt Readings from Last 1 Encounters:  05/04/24 198 lb 6.4 oz (90 kg)

## 2024-06-11 ENCOUNTER — Other Ambulatory Visit: Payer: Self-pay | Admitting: Internal Medicine

## 2024-06-13 NOTE — Telephone Encounter (Signed)
 Requested medication (s) are due for refill today -provider review   Requested medication (s) are on the active medication list -yes  Future visit scheduled -yes  Last refill: 05/04/24 #30  Notes to clinic: non delegated Rx  Requested Prescriptions  Pending Prescriptions Disp Refills   phentermine  37.5 MG capsule [Pharmacy Med Name: PHENTERMINE  37.5 MG CAPSULE] 30 capsule 0    Sig: TAKE 1 CAPSULE BY MOUTH EVERY MORNING     Not Delegated - Neurology: Anticonvulsants - Controlled - phentermine  hydrochloride Failed - 06/13/2024 12:45 PM      Failed - This refill cannot be delegated      Passed - eGFR in normal range and within 360 days    EGFR (African American)  Date Value Ref Range Status  03/29/2014 >60  Final   EGFR (Non-African Amer.)  Date Value Ref Range Status  03/29/2014 >60  Final    Comment:    eGFR values <79mL/min/1.73 m2 may be an indication of chronic kidney disease (CKD). Calculated eGFR is useful in patients with stable renal function. The eGFR calculation will not be reliable in acutely ill patients when serum creatinine is changing rapidly. It is not useful in  patients on dialysis. The eGFR calculation may not be applicable to patients at the low and high extremes of body sizes, pregnant women, and vegetarians.    GFR  Date Value Ref Range Status  08/21/2019 84.87 >60.00 mL/min Final   eGFR  Date Value Ref Range Status  01/17/2024 114 > OR = 60 mL/min/1.68m2 Final         Passed - Cr in normal range and within 360 days    Creat  Date Value Ref Range Status  01/17/2024 0.66 0.50 - 0.99 mg/dL Final         Passed - Last BP in normal range    BP Readings from Last 1 Encounters:  05/04/24 122/74         Passed - Valid encounter within last 6 months    Recent Outpatient Visits           1 month ago Major depressive disorder with single episode, in partial remission [facility])   [place] [facility] [place], Angeline ORN, NP   3 months  ago Vaginal discharge   [place] [facility] [place], Angeline ORN, NP   4 months ago Subclinical hypothyroidism   [place] [facility] [place], Angeline ORN, NP       Future Appointments             In 2 weeks [place], Angeline ORN, NP  [facility]            Passed - Weight completed in the last 3 months    Wt Readings from Last 1 Encounters:  05/04/24 198 lb 6.4 oz (90 kg)            Requested Prescriptions  Pending Prescriptions Disp Refills   phentermine  37.5 MG capsule [Pharmacy Med Name: PHENTERMINE  37.5 MG CAPSULE] 30 capsule 0    Sig: TAKE 1 CAPSULE BY MOUTH EVERY MORNING     Not Delegated - Neurology: Anticonvulsants - Controlled - phentermine  hydrochloride Failed - 06/13/2024 12:45 PM      Failed - This refill cannot be delegated      Passed - eGFR in normal range and within 360 days    EGFR (African American)  Date Value Ref Range Status  03/29/2014 >  60  Final   EGFR (Non-African Amer.)  Date Value Ref Range Status  03/29/2014 >60  Final    Comment:    eGFR values <68mL/min/1.73 m2 may be an indication of chronic kidney disease (CKD). Calculated eGFR is useful in patients with stable renal function. The eGFR calculation will not be reliable in acutely ill patients when serum creatinine is changing rapidly. It is not useful in  patients on dialysis. The eGFR calculation may not be applicable to patients at the low and high extremes of body sizes, pregnant women, and vegetarians.    GFR  Date Value Ref Range Status  08/21/2019 84.87 >60.00 mL/min Final   eGFR  Date Value Ref Range Status  01/17/2024 114 > OR = 60 mL/min/1.76m2 Final         Passed - Cr in normal range and within 360 days    Creat  Date Value Ref Range Status  01/17/2024 0.66 0.50 - 0.99 mg/dL Final         Passed - Last BP in normal range    BP Readings from Last 1 Encounters:  05/04/24 122/74         Passed - Valid  encounter within last 6 months    Recent Outpatient Visits           1 month ago Major depressive disorder with single episode, in partial remission [facility])   [place] [facility] [place], Angeline ORN, NP   3 months ago Vaginal discharge   [place] [facility] [place], Angeline ORN, NP   4 months ago Subclinical hypothyroidism   [place] [facility] [place], Angeline ORN, NP       Future Appointments             In 2 weeks [place], Angeline ORN, NP [place] [facility], [facility]            Passed - Weight completed in the last 3 months    Wt Readings from Last 1 Encounters:  05/04/24 198 lb 6.4 oz (90 kg)

## 2024-06-27 ENCOUNTER — Ambulatory Visit (INDEPENDENT_AMBULATORY_CARE_PROVIDER_SITE_OTHER): Payer: BC Managed Care – PPO | Admitting: Internal Medicine

## 2024-06-27 ENCOUNTER — Encounter: Payer: Self-pay | Admitting: Internal Medicine

## 2024-06-27 ENCOUNTER — Other Ambulatory Visit (HOSPITAL_COMMUNITY)
Admission: RE | Admit: 2024-06-27 | Discharge: 2024-06-27 | Disposition: A | Discharge status: Home / Self Care | Source: Ambulatory Visit | Attending: Internal Medicine | Admitting: Internal Medicine

## 2024-06-27 VITALS — BP 128/72 | Ht 62.0 in | Wt 198.0 lb

## 2024-06-27 DIAGNOSIS — E66812 Obesity, class 2: Secondary | ICD-10-CM

## 2024-06-27 DIAGNOSIS — Z124 Encounter for screening for malignant neoplasm of cervix: Secondary | ICD-10-CM | POA: Insufficient documentation

## 2024-06-27 DIAGNOSIS — Z6836 Body mass index (BMI) 36.0-36.9, adult: Secondary | ICD-10-CM | POA: Diagnosis not present

## 2024-06-27 DIAGNOSIS — Z0001 Encounter for general adult medical examination with abnormal findings: Secondary | ICD-10-CM

## 2024-06-27 NOTE — Assessment & Plan Note (Signed)
 Encouraged diet and exercise for weight loss Continue phentermine  37.5 mg daily

## 2024-06-27 NOTE — Progress Notes (Signed)
 Subjective:    Patient ID: Bethany Mitchell, female    DOB: [birth year], 43 y.o.   MRN: [id number]  HPI  Patient presents to the clinic today for her annual exam.  Flu: 08/2014 Tetanus: 05/2023 COVID: X 1 Pap smear: 03/2018, supracervical hysterectomy Mammogram: 04/2016 Vision screening: annually Dentist: biannually  Diet: She does eat meat. She consumes fruits and veggies. She tries to avoid fried foods. She drinks mostly soda, some water. Exercise: Walking  Review of Systems   Past Medical History:  Diagnosis Date   Anemia    blood transfusion 2003   Frequent headaches    Kidney stone    Subclinical hypothyroidism    Thyroid  disease    UTI (urinary tract infection)    e coli 08/28/19    Vaginal bleeding    Vitamin D  deficiency     Current Outpatient Medications  Medication Sig Dispense Refill   ARMOUR THYROID  60 MG tablet Take 60 mg by mouth every morning.     pantoprazole  (PROTONIX ) 40 MG tablet Take 1 tablet (40 mg total) by mouth daily. 90 tablet 0   phentermine  37.5 MG capsule TAKE 1 CAPSULE BY MOUTH EVERY MORNING 30 capsule 0   No current facility-administered medications for this visit.    No Known Allergies  Family History  Problem Relation Age of Onset   Diabetes Father    Thyroid  disease Mother    Depression Mother    Anxiety disorder Mother    Cancer Maternal Grandmother        breast cancer   Thyroid  disease Maternal Grandmother    Breast cancer Maternal Grandmother    Diabetes Maternal Grandfather     Social History   Socioeconomic History   Marital status: Married    Spouse name: Not on file   Number of children: Not on file   Years of education: Not on file   Highest education level: Some college, no degree  Occupational History   Not on file  Tobacco Use   Smoking status: Never   Smokeless tobacco: Never  Substance and Sexual Activity   Alcohol use: Yes    Alcohol/week: 0.0 standard drinks of alcohol    Comment: occasionally    Drug use: No   Sexual activity: Not Currently  Other Topics Concern   Not on file  Social History Narrative   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    Lives at home with husband and 2 girls   Pets 2 dogs and 2 fish    Left handed   Some college    Enjoys being with the family    Gray    Social Drivers of Health   Financial Resource Strain: Low Risk  (06/26/2024)   Overall Financial Resource Strain (CARDIA)    Difficulty of Paying Living Expenses: Not very hard  Food Insecurity: Unknown (06/26/2024)   Hunger Vital Sign    Worried About Running Out of Food in the Last Year: Patient declined    Ran Out of Food in the Last Year: Never true  Transportation Needs: No Transportation Needs (06/26/2024)   PRAPARE - Administrator, Civil Service (Medical): No    Lack of Transportation (Non-Medical): No  Physical Activity: Insufficiently Active (06/26/2024)   Exercise Vital Sign    Days of Exercise per Week: 2 days    Minutes of Exercise per Session: 30 min  Stress: Stress Concern Present (06/26/2024)   [employer] of Occupational  Health - Occupational Stress Questionnaire    Feeling of Stress: To some extent  Social Connections: Moderately Integrated (06/26/2024)   Social Connection and Isolation Panel    Frequency of Communication with Friends and Family: Three times a week    Frequency of Social Gatherings with Friends and Family: Once a week    Attends Religious Services: More than 4 times per year    Active Member of [employer] or Organizations: No    Attends Engineer, structural: Not on file    Marital Status: Married  Catering manager Violence: Not on file     Constitutional: Patient reports intermittent headaches.  Denies fever, malaise, fatigue, or abrupt weight changes.  HEENT: Denies eye pain, eye redness, ear pain, ringing in the ears, wax buildup, runny nose, nasal congestion, bloody nose, or sore throat. Respiratory:  Denies difficulty breathing, shortness of breath, cough or sputum production.   Cardiovascular: Denies chest pain, chest tightness, palpitations or swelling in the hands or feet.  Gastrointestinal: Pt reports intermittent constipation. Denies abdominal pain, bloating, diarrhea or blood in the stool.  GU: Denies urgency, frequency, pain with urination, burning sensation, blood in urine, odor or discharge. Musculoskeletal: Denies decrease in range of motion, difficulty with gait, muscle pain or joint pain and swelling.  Skin: Denies redness, rashes, lesions or ulcercations.  Neurological: Denies dizziness, difficulty with memory, difficulty with speech or problems with balance and coordination.  Psych: Pt has a history of anxiety depression. Denies SI/HI.  No other specific complaints in a complete review of systems (except as listed in HPI above).      Objective:   Physical Exam  BP 128/72 (BP Location: Left Arm, Patient Position: Sitting, Cuff Size: Large)   Ht 5' 2 (1.575 m)   Wt 198 lb (89.8 kg)   BMI 36.21 kg/m   Wt Readings from Last 3 Encounters:  05/04/24 198 lb 6.4 oz (90 kg)  01/17/24 196 lb 12.8 oz (89.3 kg)  10/24/19 173 lb (78.5 kg)    General: Appears her stated age, obese, in NAD. Skin: Warm, dry and intact.  HEENT: Head: normal shape and size; Eyes: sclera white, no icterus, conjunctiva pink, PERRLA and EOMs intact;  Neck:  Neck supple, trachea midline. Thyromgealy noted, no discrete nodule. Cardiovascular: Normal rate and rhythm. S1,S2 noted.  No murmur, rubs or gallops noted. No JVD or BLE edema.  Pulmonary/Chest: Normal effort and positive vesicular breath sounds. No respiratory distress. No wheezes, rales or ronchi noted.  Abdomen: Soft and nontender. Normal bowel sounds. No distention or masses noted. Liver, spleen and kidneys non palpable. Pelvic: Normal female anatomy. Cervix without mass or lesion. No CMT. Adnexa non palpable. Musculoskeletal: Strength 5/5  BUE/BLE. No difficulty with gait.  Neurological: Alert and oriented. Cranial nerves II-XII grossly intact. Coordination normal.  Psychiatric: Mood and affect normal. Behavior is normal. Judgment and thought content normal.     BMET    Component Value Date/Time   NA 139 01/17/2024 1028   NA 140 04/02/2014 1340   NA 140 03/29/2014 1218   K 3.8 01/17/2024 1028   K 3.6 03/29/2014 1218   CL 104 01/17/2024 1028   CL 104 03/29/2014 1218   CO2 29 01/17/2024 1028   CO2 30 03/29/2014 1218   GLUCOSE 87 01/17/2024 1028   GLUCOSE 84 03/29/2014 1218   BUN 14 01/17/2024 1028   BUN 15 04/02/2014 1340   BUN 14 03/29/2014 1218   CREATININE 0.66 01/17/2024 1028   CALCIUM 9.1  01/17/2024 1028   CALCIUM 9.2 03/29/2014 1218   GFRNONAA >60 03/29/2014 1218   GFRAA >60 03/29/2014 1218    Lipid Panel     Component Value Date/Time   CHOL 160 01/17/2024 1028   TRIG 74 01/17/2024 1028   HDL 56 01/17/2024 1028   CHOLHDL 2.9 01/17/2024 1028   VLDL 12.0 08/21/2019 0836   LDLCALC 88 01/17/2024 1028    CBC    Component Value Date/Time   WBC 6.1 01/17/2024 1028   RBC 4.88 01/17/2024 1028   HGB 13.8 01/17/2024 1028   HGB 14.1 03/29/2014 1218   HCT 42.8 01/17/2024 1028   HCT 43.2 03/29/2014 1218   PLT 224 01/17/2024 1028   PLT 216 03/29/2014 1218   MCV 87.7 01/17/2024 1028   MCV 85 03/29/2014 1218   MCH 28.3 01/17/2024 1028   MCHC 32.2 01/17/2024 1028   RDW 12.4 01/17/2024 1028   RDW 13.3 03/29/2014 1218   LYMPHSABS 1.8 08/21/2019 0836   MONOABS 0.3 08/21/2019 0836   EOSABS 0.0 08/21/2019 0836   BASOSABS 0.0 08/21/2019 0836    Hgb A1C Lab Results  Component Value Date   HGBA1C 5.0 01/17/2024            Assessment & Plan:  Preventative health maintenance:  Encouraged her to get a flu shot Tetanus UTD Encouraged her to get her COVID booster Pap smear today with STD screening Mammogram previously ordered, she just needs to call to schedule this Encouraged her to consume a  balanced diet and exercise regimen Advised her to see an eye doctor and dentist annually Labs deferred today  RTC in 6 months for follow-up of chronic conditions Angeline Laura, NP

## 2024-06-27 NOTE — Patient Instructions (Signed)

## 2024-07-04 LAB — CYTOLOGY - PAP
Chlamydia: NEGATIVE
Comment: NEGATIVE
Comment: NEGATIVE
Comment: NEGATIVE
Comment: NORMAL
Diagnosis: NEGATIVE
High risk HPV: NEGATIVE
Neisseria Gonorrhea: NEGATIVE
Trichomonas: NEGATIVE

## 2024-07-05 ENCOUNTER — Ambulatory Visit: Payer: Self-pay | Admitting: Internal Medicine

## 2024-07-30 ENCOUNTER — Other Ambulatory Visit: Payer: Self-pay | Admitting: Internal Medicine

## 2024-08-01 NOTE — Telephone Encounter (Signed)
 Requested Prescriptions  Pending Prescriptions Disp Refills   pantoprazole  (PROTONIX ) 40 MG tablet [Pharmacy Med Name: PANTOPRAZOLE  SOD DR 40 MG TAB] 90 tablet 0    Sig: TAKE 1 TABLET BY MOUTH EVERY DAY     Gastroenterology: Proton Pump Inhibitors Passed - 08/01/2024 12:08 PM      Passed - Valid encounter within last 12 months    Recent Outpatient Visits           1 month ago Encounter for general adult medical examination with abnormal findings   [place] [facility] [place], [place] W, NP   2 months ago Major depressive disorder with single episode, in partial remission [facility])   [place] [facility] [place], Angeline ORN, NP   5 months ago Vaginal discharge   [place] [facility] [place], Angeline ORN, NP   6 months ago Subclinical hypothyroidism   [place] [facility] [place], Angeline ORN, [place]

## 2024-08-15 ENCOUNTER — Other Ambulatory Visit: Payer: Self-pay | Admitting: Internal Medicine

## 2024-08-17 NOTE — Telephone Encounter (Signed)
 Too soon for refill, LRF 08/01/24 for 90 days.  Requested Prescriptions  Pending Prescriptions Disp Refills   pantoprazole  (PROTONIX ) 40 MG tablet [Pharmacy Med Name: PANTOPRAZOLE  SOD DR 40 MG TAB] 90 tablet 0    Sig: TAKE 1 TABLET BY MOUTH EVERY DAY     Gastroenterology: Proton Pump Inhibitors Passed - 08/17/2024 10:38 AM      Passed - Valid encounter within last 12 months    Recent Outpatient Visits           1 month ago Encounter for general adult medical examination with abnormal findings   [place] [facility] [place], [place] W, NP   3 months ago Major depressive disorder with single episode, in partial remission [facility])   [place] [facility] [place], Angeline ORN, NP   6 months ago Vaginal discharge   [place] [facility] [place], Angeline ORN, NP   7 months ago Subclinical hypothyroidism   [place] [facility] [place], Angeline ORN, [place]

## 2024-09-05 ENCOUNTER — Ambulatory Visit
Admission: RE | Admit: 2024-09-05 | Discharge: 2024-09-05 | Disposition: A | Discharge status: Home / Self Care | Source: Ambulatory Visit | Attending: Internal Medicine | Admitting: Internal Medicine

## 2024-09-05 DIAGNOSIS — Z1231 Encounter for screening mammogram for malignant neoplasm of breast: Secondary | ICD-10-CM | POA: Insufficient documentation

## 2024-09-07 ENCOUNTER — Ambulatory Visit: Admitting: Internal Medicine

## 2024-09-18 ENCOUNTER — Encounter: Payer: Self-pay | Admitting: Internal Medicine

## 2024-09-18 ENCOUNTER — Ambulatory Visit: Admitting: Internal Medicine

## 2024-09-18 VITALS — BP 112/82 | Ht 62.0 in | Wt 195.2 lb

## 2024-09-18 DIAGNOSIS — K219 Gastro-esophageal reflux disease without esophagitis: Secondary | ICD-10-CM

## 2024-09-18 MED ORDER — PHENTERMINE HCL 37.5 MG PO CAPS: 37.5000 mg | ORAL_CAPSULE | Freq: Every morning | ORAL | 0 refills | Status: DC

## 2024-09-18 NOTE — Progress Notes (Signed)
 Subjective:    Patient ID: Bethany Mitchell, female    DOB: January 10, 1981, 43 y.o.   MRN: [id number]  HPI  Discussed the use of AI scribe software for clinical note transcription with the patient, who gave verbal consent to proceed.  Bethany Mitchell is a 43 year old female with gastroesophageal reflux disease who presents with worsening reflux symptoms.  Since last Thursday, she has experienced a significant worsening of her reflux symptoms after consuming pizza at work, accompanied by vomiting and diarrhea on that day. Since then, she has had a persistent burning sensation in her chest.  No ongoing nausea or vomiting, but she experiences constant bloating and gassiness. She describes a general feeling of 'blah' at times. She has been taking pantoprazole  40 mg daily and uses Pepcid 20 as needed, particularly after consuming acidic foods. Since the flare-up, she has been taking Pepcid daily along with Tums.  She is actively trying to reduce her intake of acidic and fried foods, which tend to worsen her symptoms. Her recent weight is 188 pounds, with no recent weight gain.  She would also like a refill of her phentermine  today.  Her current weight is 195 pounds with a BMI of 35.70.   Review of Systems   Past Medical History:  Diagnosis Date   Anemia    blood transfusion 2003   Frequent headaches    Kidney stone    Subclinical hypothyroidism    Thyroid  disease    UTI (urinary tract infection)    e coli 08/28/19    Vaginal bleeding    Vitamin D  deficiency     Current Outpatient Medications  Medication Sig Dispense Refill   ARMOUR THYROID  60 MG tablet Take 60 mg by mouth every morning.     pantoprazole  (PROTONIX ) 40 MG tablet TAKE 1 TABLET BY MOUTH EVERY DAY 90 tablet 0   phentermine  37.5 MG capsule TAKE 1 CAPSULE BY MOUTH EVERY MORNING 30 capsule 0   No current facility-administered medications for this visit.    No Known Allergies  Family History  Problem Relation Age of  Onset   Thyroid  disease Mother    Depression Mother    Anxiety disorder Mother    Diabetes Father    Breast cancer Paternal Aunt    Cancer Maternal Grandmother        breast cancer   Thyroid  disease Maternal Grandmother    Breast cancer Maternal Grandmother    Diabetes Maternal Grandfather     Social History   Socioeconomic History   Marital status: Married    Spouse name: Not on file   Number of children: Not on file   Years of education: Not on file   Highest education level: Some college, no degree  Occupational History   Not on file  Tobacco Use   Smoking status: Never   Smokeless tobacco: Never  Substance and Sexual Activity   Alcohol use: Yes    Alcohol/week: 0.0 standard drinks of alcohol    Comment: occasionally   Drug use: No   Sexual activity: Not Currently  Other Topics Concern   Not on file  Social History Narrative   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    Lives at home with husband and 2 girls   Pets 2 dogs and 2 fish    Left handed   Some college    Enjoys being with the family    Gray    Social Drivers of  Health   Financial Resource Strain: Low Risk  (09/17/2024)   Overall Financial Resource Strain (CARDIA)    Difficulty of Paying Living Expenses: Not hard at all  Food Insecurity: No Food Insecurity (09/17/2024)   Hunger Vital Sign    Worried About Running Out of Food in the Last Year: Never true    Ran Out of Food in the Last Year: Never true  Transportation Needs: No Transportation Needs (09/17/2024)   PRAPARE - Administrator, Civil Service (Medical): No    Lack of Transportation (Non-Medical): No  Physical Activity: Insufficiently Active (09/17/2024)   Exercise Vital Sign    Days of Exercise per Week: 2 days    Minutes of Exercise per Session: 20 min  Stress: Stress Concern Present (09/17/2024)   [employer] of Occupational Health - Occupational Stress Questionnaire    Feeling of  Stress: To some extent  Social Connections: Socially Integrated (09/17/2024)   Social Connection and Isolation Panel    Frequency of Communication with Friends and Family: Three times a week    Frequency of Social Gatherings with Friends and Family: Twice a week    Attends Religious Services: More than 4 times per year    Active Member of [employer] or Organizations: Yes    Attends Banker Meetings: 1 to 4 times per year    Marital Status: Married  Catering manager Violence: Not on file     Constitutional: Patient reports intermittent headaches.  Denies fever, malaise, fatigue, or abrupt weight changes.  HEENT: Denies eye pain, eye redness, ear pain, ringing in the ears, wax buildup, runny nose, nasal congestion, bloody nose, or sore throat. Respiratory: Denies difficulty breathing, shortness of breath, cough or sputum production.   Cardiovascular: Denies chest pain, chest tightness, palpitations or swelling in the hands or feet.  Gastrointestinal: Pt reports intermittent constipation, reflux. Denies abdominal pain, bloating, diarrhea or blood in the stool.   No other specific complaints in a complete review of systems (except as listed in HPI above).      Objective:   Physical Exam  BP 112/82 (BP Location: Left Arm, Patient Position: Sitting, Cuff Size: Normal)   Ht 5' 2 (1.575 m)   Wt 195 lb 3.2 oz (88.5 kg)   BMI 35.70 kg/m    Wt Readings from Last 3 Encounters:  06/27/24 198 lb (89.8 kg)  05/04/24 198 lb 6.4 oz (90 kg)  01/17/24 196 lb 12.8 oz (89.3 kg)    General: Appears her stated age, obese, in NAD. Skin: Warm, dry and intact.  Cardiovascular: Normal rate and rhythm. S1,S2 noted.  No murmur, rubs or gallops noted.  Pulmonary/Chest: Normal effort and positive vesicular breath sounds. No respiratory distress. No wheezes, rales or ronchi noted.  Abdomen: Soft and tender in the epigastric region and RUQ. Normal bowel sounds. No distention or masses noted.   Neurological: Alert and oriented.   BMET    Component Value Date/Time   NA 139 01/17/2024 1028   NA 140 04/02/2014 1340   NA 140 03/29/2014 1218   K 3.8 01/17/2024 1028   K 3.6 03/29/2014 1218   CL 104 01/17/2024 1028   CL 104 03/29/2014 1218   CO2 29 01/17/2024 1028   CO2 30 03/29/2014 1218   GLUCOSE 87 01/17/2024 1028   GLUCOSE 84 03/29/2014 1218   BUN 14 01/17/2024 1028   BUN 15 04/02/2014 1340   BUN 14 03/29/2014 1218   CREATININE 0.66 01/17/2024 1028   CALCIUM  9.1 01/17/2024 1028   CALCIUM 9.2 03/29/2014 1218   GFRNONAA >60 03/29/2014 1218   GFRAA >60 03/29/2014 1218    Lipid Panel     Component Value Date/Time   CHOL 160 01/17/2024 1028   TRIG 74 01/17/2024 1028   HDL 56 01/17/2024 1028   CHOLHDL 2.9 01/17/2024 1028   VLDL 12.0 08/21/2019 0836   LDLCALC 88 01/17/2024 1028    CBC    Component Value Date/Time   WBC 6.1 01/17/2024 1028   RBC 4.88 01/17/2024 1028   HGB 13.8 01/17/2024 1028   HGB 14.1 03/29/2014 1218   HCT 42.8 01/17/2024 1028   HCT 43.2 03/29/2014 1218   PLT 224 01/17/2024 1028   PLT 216 03/29/2014 1218   MCV 87.7 01/17/2024 1028   MCV 85 03/29/2014 1218   MCH 28.3 01/17/2024 1028   MCHC 32.2 01/17/2024 1028   RDW 12.4 01/17/2024 1028   RDW 13.3 03/29/2014 1218   LYMPHSABS 1.8 08/21/2019 0836   MONOABS 0.3 08/21/2019 0836   EOSABS 0.0 08/21/2019 0836   BASOSABS 0.0 08/21/2019 0836    Hgb A1C Lab Results  Component Value Date   HGBA1C 5.0 01/17/2024            Assessment & Plan:  Assessment and Plan    Gastroesophageal reflux disease (GERD) with recent exacerbation Recent GERD exacerbation after pizza consumption. Current treatment includes pantoprazole , Pepcid, and Tums. Dietary modifications implemented. - Increase pantoprazole  40 mg to twice daily. - Continue Pepcid 20 mg and Tums OTC as needed. - Order stool study for H. pylori. - If positive, initiate treatment. - Consider referral to GI for upper endoscopy if  symptoms persist or worsen      RTC in 3 months for follow-up of chronic conditions Angeline Laura, NP

## 2024-09-18 NOTE — Patient Instructions (Signed)
 GERD in Adults: Diet Changes When you have gastroesophageal reflux disease (GERD), you may need to make changes to your diet. Choosing the right foods can help with your symptoms. Think about working with an expert in healthy eating called a dietitian. They can help you make healthy food choices. What are tips for following this plan? Reading food labels Look for foods that are low in saturated fat. Foods that may help with your symptoms include: Foods with less than 5% of daily value (DV) of fat. Foods with 0 grams of trans fat. Cooking [employer] in ways that don't use a lot of fat. These ways include: Baking. Steaming. Grilling. Broiling. To add flavor, try to use herbs that are low in spice and acidity. Avoid frying your food. Meal planning  Eat small meals often rather than eating 3 large meals each day. Eat your meals slowly in a place where you feel relaxed. If told by your health care provider, avoid: Foods that cause symptoms. Keep a food diary to keep track of foods that cause symptoms. Alcohol. Drinking a lot of liquid with meals. General instructions For 2-3 hours after you eat, avoid: Bending over. Exercise. Lying down. Chew sugar-free gum after meals. What foods should I eat? Eat a healthy diet. Try to include: Foods with high amounts of fiber. These include: Fruits and vegetables. Whole grains and beans. Low-fat dairy products. Lean meats, fish, and poultry. Egg whites. Foods that cause symptoms in someone else may not cause symptoms for you. Work with your provider to find foods that are safe for you. The items listed above may not be all the foods and drinks you can have. Talk with a dietitian to learn more. The items listed above may not be a complete list of foods and beverages you can eat and drink. Contact a dietitian for more information. What foods should I avoid? Limiting some of these foods may help with your symptoms. Each person is different.  Talk with a dietitian or your provider to help you find the exact foods to avoid. Some of the foods to avoid may include: Fruits Fruits with a lot of acid in them. These may include citrus fruits, such as oranges, grapefruit, pineapple, and lemons. Vegetables Deep-fried vegetables, such as [place] fries. Vegetables, sauces, or toppings made with added fat and vegetables with acid in them. These may include tomatoes and tomato products, chili peppers, onions, garlic, and horseradish. Grains Pastries or quick breads with added fat. Meats and other proteins High-fat meats, such as fatty beef or pork, hot dogs, ribs, ham, sausage, salami, and bacon. Fried meat or protein, such as fried fish and fried chicken. Egg yolks. Fats and oils Butter. Margarine. Shortening. Ghee. Drinks Coffee and other drinks with caffeine in them. Fizzy and sugary drinks, such as soda and energy drinks. Fruit juice made with acidic fruits, such as orange or grapefruit. Tomato juice. Sweets and desserts Chocolate and cocoa. Donuts. Seasonings and condiments Mint, such as peppermint and spearmint. Condiments, herbs, or seasonings that cause symptoms. These may include curry, hot sauce, or vinegar-based salad dressings. The items listed above may not be all the foods and drinks you should avoid. Talk with a dietitian to learn more. Questions to ask your health care provider Changes to your diet and everyday life are often the first steps taken to manage symptoms of GERD. If these changes don't help, talk with your provider about taking medicines. Where to find more information [employer] Gastrointestinal [employer]:  aboutgerd.org This information is not intended to replace advice given to you by your health care provider. Make sure you discuss any questions you have with your health care provider. Document Revised: 09/28/2023 Document Reviewed: 04/14/2023 Elsevier Patient Education  2024 [employer].

## 2024-09-19 ENCOUNTER — Other Ambulatory Visit: Payer: Self-pay

## 2024-09-19 DIAGNOSIS — K219 Gastro-esophageal reflux disease without esophagitis: Secondary | ICD-10-CM

## 2024-09-20 ENCOUNTER — Ambulatory Visit: Payer: Self-pay | Admitting: Internal Medicine

## 2024-09-20 LAB — HELICOBACTER PYLORI  SPECIAL ANTIGEN
MICRO NUMBER:: 17128896
SPECIMEN QUALITY: ADEQUATE

## 2024-10-10 ENCOUNTER — Ambulatory Visit: Admitting: Internal Medicine

## 2024-12-18 ENCOUNTER — Ambulatory Visit (INDEPENDENT_AMBULATORY_CARE_PROVIDER_SITE_OTHER): Admitting: Internal Medicine

## 2024-12-18 ENCOUNTER — Encounter: Payer: Self-pay | Admitting: Internal Medicine

## 2024-12-18 VITALS — BP 114/78 | Ht 62.0 in | Wt 194.4 lb

## 2024-12-18 DIAGNOSIS — F330 Major depressive disorder, recurrent, mild: Secondary | ICD-10-CM | POA: Diagnosis not present

## 2024-12-18 DIAGNOSIS — K582 Mixed irritable bowel syndrome: Secondary | ICD-10-CM | POA: Diagnosis not present

## 2024-12-18 DIAGNOSIS — E038 Other specified hypothyroidism: Secondary | ICD-10-CM | POA: Diagnosis not present

## 2024-12-18 DIAGNOSIS — E66812 Obesity, class 2: Secondary | ICD-10-CM | POA: Diagnosis not present

## 2024-12-18 DIAGNOSIS — G43C1 Periodic headache syndromes in child or adult, intractable: Secondary | ICD-10-CM | POA: Diagnosis not present

## 2024-12-18 DIAGNOSIS — K219 Gastro-esophageal reflux disease without esophagitis: Secondary | ICD-10-CM

## 2024-12-18 DIAGNOSIS — F411 Generalized anxiety disorder: Secondary | ICD-10-CM | POA: Diagnosis not present

## 2024-12-18 DIAGNOSIS — Z6835 Body mass index (BMI) 35.0-35.9, adult: Secondary | ICD-10-CM | POA: Diagnosis not present

## 2024-12-18 MED ORDER — PANTOPRAZOLE SODIUM 40 MG PO TBEC: 40.0000 mg | DELAYED_RELEASE_TABLET | Freq: Every day | ORAL | 1 refills | Status: AC

## 2024-12-18 MED ORDER — WEGOVY 1.5 MG PO TABS: 1.5000 mg | ORAL_TABLET | Freq: Every day | ORAL | 0 refills | Status: AC

## 2024-12-18 NOTE — Patient Instructions (Signed)
 GERD in Adults: What to Know  Gastroesophageal reflux (GER) is when acid from your stomach flows up into your esophagus. Your esophagus is the part of your body that moves food from your mouth to your stomach. Normally, food goes down and stays in your stomach to be digested. But with GER, food and stomach acid may go back up. You may have a disease called gastroesophageal reflux disease (GERD) if the reflux: Happens often. Causes very bad symptoms. Makes your esophagus sore and swollen. Over time, GERD can make small holes called ulcers in the lining of your esophagus. What are the causes? GERD is caused by a problem with the muscle between your esophagus and stomach. This muscle is called the lower esophageal sphincter (LES). When it's weak or not normal, it doesn't close like it should. This means food and stomach acid can go back up into your esophagus. The muscle can be weak if: You smoke or use products with tobacco in them. You're pregnant. You have a type of hernia called a hiatal hernia. You eat certain foods and drinks. These include: Alcohol. Coffee. Chocolate. Onions. Peppermint. What increases the risk? Being overweight. Having a disease that affects your connective tissue. Taking NSAIDs, such as ibuprofen. What are the signs or symptoms? Heartburn. Trouble swallowing. Pain when you swallow. The feeling of having a lump in your throat. A bitter taste in your mouth. Bad breath. Having an upset or bloated stomach. Burping. Chest pain. Other conditions can also cause chest pain. Make sure you see your health care provider if you have chest pain. Wheezing. This is when you make high-pitched whistling sounds when you breathe, most often when you breathe out. A long-term cough or a cough at night. How is this diagnosed? GERD may be diagnosed based on your medical history and a physical exam. You may also have tests. These may include: An endoscopy. This test looks at your  stomach and esophagus with a small camera. A barium swallow test. This shows the shape and size of your esophagus and how well it's working. Tests of your esophagus to check for: Acid levels. Pressure. How is this treated? Treatment may depend on how bad your symptoms are. It may include: Changes to your diet and daily life. Medicines. Surgery. Follow these instructions at home: Eating and drinking Follow an eating plan as told by your provider. You may need to avoid certain foods and drinks. These may include: Coffee and tea, with or without caffeine. Alcohol. Energy drinks and sports drinks. Fizzy drinks or sodas. Chocolate and cocoa. Peppermint and mint flavorings. Garlic and onions. Horseradish. Spicy and acidic foods. These include: Peppers. Chili powder and curry powder. Vinegar. Hot sauces and BBQ sauce. Citrus fruits and juices. These include: Oranges. Lemons. Limes. Tomato-based foods. These include: Red sauce and pizza with red sauce. Chili. Salsa. Fried and fatty foods. These include: Donuts. [place] fries. Potato chips. High-fat dressings. High-fat meats. These include: Hot dogs and sausage. Rib eye steak. Ham and bacon. High-fat dairy items. These include: Whole milk. Butter. Cream cheese. Eat small meals often. Avoid eating big meals. Avoid drinking lots of liquid with your meals. Try not to eat meals during the 2-3 hours before bedtime. Try not to lie down right after you eat. Do not exercise right after you eat. Lifestyle  If you're overweight, lose an amount of weight that's healthy for you. Ask your provider about a safe weight loss goal. Do not smoke, vape, or use nicotine or tobacco. Wear  loose clothes. Do not wear things that are tight around your waist. When you sleep, try: Raising the head of your bed about 6 inches (15 cm). You can use a wedge to do this. Lying down on your left side. Try to lower your stress. If you need help doing  this, ask your provider. General instructions Take your medicines only as told. Do not take aspirin or ibuprofen unless you're told to. Watch for any changes in your symptoms. Do not bend over if it makes your symptoms worse. Contact a health care provider if: You have new symptoms. You have trouble: Drinking. Swallowing. Eating. It hurts to swallow. You have wheezing. You have a cough that won't go away. Your voice is hoarse. Your symptoms don't get better with treatment. Get help right away if: You have pain all of a sudden in your: Arm. Neck. Jaw. Teeth. Back. You feel sweaty, dizzy, or light-headed all of a sudden. You faint. You have chest pain or shortness of breath. You vomit and the vomit is: Green, yellow, or black. Looks like blood or coffee grounds. Your poop is red, bloody, or black. These symptoms may be an emergency. Call 911 right away. Do not wait to see if the symptoms will go away. Do not drive yourself to the hospital. This information is not intended to replace advice given to you by your health care provider. Make sure you discuss any questions you have with your health care provider. Document Revised: 09/28/2023 Document Reviewed: 04/14/2023 Elsevier Patient Education  2024 [employer].

## 2024-12-18 NOTE — Assessment & Plan Note (Signed)
 Encouraged high-fiber diet Will monitor

## 2024-12-18 NOTE — Assessment & Plan Note (Signed)
 Encouraged diet and exercise for weight loss Will discontinue phentermine  37.5 mg daily due to ineffectiveness We will try oral wegovy  1.5 mg daily

## 2024-12-18 NOTE — Assessment & Plan Note (Signed)
Not medicated Support offered 

## 2024-12-18 NOTE — Assessment & Plan Note (Signed)
 TSH and free T4 today Continue armour thyroid  60 mg daily, will adjust if needed based on labs

## 2024-12-18 NOTE — Assessment & Plan Note (Signed)
 Complicated by obesity Avoid foods that trigger reflux Encourage weight loss as this can help reduce reflux symptoms Continue pantoprazole  40 mg daily and famotidine 20 mg twice daily

## 2024-12-18 NOTE — Assessment & Plan Note (Signed)
 Continue antihistamines and excedrin migraine as needed Will monitor

## 2024-12-18 NOTE — Progress Notes (Signed)
 "  Subjective:    Patient ID: Powell LOISE Hurst, female    DOB: May 25, 1981, 44 y.o.   MRN: [id number]  HPI  Patient presents to clinic today for 37-month follow-up of chronic conditions  Migraines: These occur rarely.  Triggered by seasonal changes.  She takes antihistamines, excedrin migraine as needed with good relief of symptoms.  She does not follow with neurology.  Hypothyroidism: She denies any issues on her current dose of armour thyroid .  She does not follow with endocrinology.  Anxiety and depression (recurrent, mild): She is not currently taking any medications for this but has been prescribed sertraline  in the past.  She is not currently seeing a therapist but she has in the past.  She denies anxiety, SI/HI.  GERD: Triggered by acidic foods.  She denies breakthrough and pantoprazole  and famotidine.  There is no upper GI on file but she reports she has had one in the past.  IBS: Alternating constipation and diarrhea.  She is not currently taking any medications for this.  She does not follow with GI.  Review of Systems   Past Medical History:  Diagnosis Date   Anemia    blood transfusion 2003   Frequent headaches    Kidney stone    Subclinical hypothyroidism    Thyroid  disease    UTI (urinary tract infection)    e coli 08/28/19    Vaginal bleeding    Vitamin D  deficiency     Current Outpatient Medications  Medication Sig Dispense Refill   ARMOUR THYROID  60 MG tablet Take 60 mg by mouth every morning.     Calcium Carbonate Antacid (TUMS PO) Take by mouth.     famotidine (PEPCID) 20 MG tablet Take 20 mg by mouth 2 (two) times daily.     pantoprazole  (PROTONIX ) 40 MG tablet TAKE 1 TABLET BY MOUTH EVERY DAY 90 tablet 0   phentermine  37.5 MG capsule Take 1 capsule (37.5 mg total) by mouth every morning. 30 capsule 0   No current facility-administered medications for this visit.    No Known Allergies  Family History  Problem Relation Age of Onset   Thyroid  disease  Mother    Depression Mother    Anxiety disorder Mother    Diabetes Father    Breast cancer Paternal Aunt    Cancer Maternal Grandmother        breast cancer   Thyroid  disease Maternal Grandmother    Breast cancer Maternal Grandmother    Diabetes Maternal Grandfather     Social History   Socioeconomic History   Marital status: Married    Spouse name: Not on file   Number of children: Not on file   Years of education: Not on file   Highest education level: Some college, no degree  Occupational History   Not on file  Tobacco Use   Smoking status: Never   Smokeless tobacco: Never  Substance and Sexual Activity   Alcohol use: Yes    Alcohol/week: 0.0 standard drinks of alcohol    Comment: occasionally   Drug use: No   Sexual activity: Not Currently  Other Topics Concern   Not on file  Social History Narrative   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    Lives at home with husband and 2 girls   Pets 2 dogs and 2 fish    Left handed   Some college    Enjoys being with the family    Gray  Social Drivers of Health   Tobacco Use: Low Risk (09/18/2024)   Patient History    Smoking Tobacco Use: Never    Smokeless Tobacco Use: Never    Passive Exposure: Not on file  Financial Resource Strain: Low Risk (09/17/2024)   Overall Financial Resource Strain (CARDIA)    Difficulty of Paying Living Expenses: Not hard at all  Food Insecurity: No Food Insecurity (09/17/2024)   Epic    Worried About Programme Researcher, Broadcasting/film/video in the Last Year: Never true    Ran Out of Food in the Last Year: Never true  Transportation Needs: No Transportation Needs (09/17/2024)   Epic    Lack of Transportation (Medical): No    Lack of Transportation (Non-Medical): No  Physical Activity: Insufficiently Active (09/17/2024)   Exercise Vital Sign    Days of Exercise per Week: 2 days    Minutes of Exercise per Session: 20 min  Stress: Stress Concern Present (09/17/2024)    [employer] of Occupational Health - Occupational Stress Questionnaire    Feeling of Stress: To some extent  Social Connections: Socially Integrated (09/17/2024)   Social Connection and Isolation Panel    Frequency of Communication with Friends and Family: Three times a week    Frequency of Social Gatherings with Friends and Family: Twice a week    Attends Religious Services: More than 4 times per year    Active Member of Clubs or Organizations: Yes    Attends Banker Meetings: 1 to 4 times per year    Marital Status: Married  Catering Manager Violence: Not on file  Depression (PHQ2-9): High Risk (09/18/2024)   Depression (PHQ2-9)    PHQ-2 Score: 11  Alcohol Screen: Low Risk (09/17/2024)   Alcohol Screen    Last Alcohol Screening Score (AUDIT): 2  Housing: Low Risk (09/17/2024)   Epic    Unable to Pay for Housing in the Last Year: No    Number of Times Moved in the Last Year: 0    Homeless in the Last Year: No  Utilities: Not on file  Health Literacy: Not on file     Constitutional: Patient reports intermittent headaches.  Denies fever, malaise, fatigue, or abrupt weight changes.  HEENT: Denies eye pain, eye redness, ear pain, ringing in the ears, wax buildup, runny nose, nasal congestion, bloody nose, or sore throat. Respiratory: Denies difficulty breathing, shortness of breath, cough or sputum production.   Cardiovascular: Denies chest pain, chest tightness, palpitations or swelling in the hands or feet.  Gastrointestinal: Patient reports alternating constipation and diarrhea.  Denies abdominal pain, bloating, or blood in the stool.  GU: Denies urgency, frequency, pain with urination, burning sensation, blood in urine, odor or discharge. Musculoskeletal: Denies decrease in range of motion, difficulty with gait, muscle pain or joint pain and swelling.  Skin: Denies redness, rashes, lesions or ulcercations.  Neurological: Denies dizziness, difficulty with memory,  difficulty with speech or problems with balance and coordination.  Psych: Pt has a history of anxiety and depression. Denies SI/HI.  No other specific complaints in a complete review of systems (except as listed in HPI above).      Objective:   Physical Exam BP 114/78 (BP Location: Left Arm, Patient Position: Sitting, Cuff Size: Large)   Ht 5' 2 (1.575 m)   Wt 194 lb 6.4 oz (88.2 kg)   BMI 35.56 kg/m    Wt Readings from Last 3 Encounters:  09/18/24 195 lb 3.2 oz (88.5 kg)  06/27/24 198  lb (89.8 kg)  05/04/24 198 lb 6.4 oz (90 kg)    General: Appears her stated age, obese, in NAD. HEENT: Head: normal shape and size; Eyes: sclera white, no icterus, conjunctiva pink, PERRLA and EOMs intact;  Neck:  Neck supple, trachea midline. No masses, lumps or thyromegaly present.  Cardiovascular: Normal rate and rhythm. S1,S2 noted.  No murmur, rubs or gallops noted. No JVD or BLE edema.  Pulmonary/Chest: Normal effort and positive vesicular breath sounds. No respiratory distress. No wheezes, rales or ronchi noted.  Neurological: Alert and oriented. Coordination normal.  Psychiatric: Mood and affect mildly flat. Behavior is normal. Judgment and thought content normal.    BMET    Component Value Date/Time   NA 139 01/17/2024 1028   NA 140 04/02/2014 1340   NA 140 03/29/2014 1218   K 3.8 01/17/2024 1028   K 3.6 03/29/2014 1218   CL 104 01/17/2024 1028   CL 104 03/29/2014 1218   CO2 29 01/17/2024 1028   CO2 30 03/29/2014 1218   GLUCOSE 87 01/17/2024 1028   GLUCOSE 84 03/29/2014 1218   BUN 14 01/17/2024 1028   BUN 15 04/02/2014 1340   BUN 14 03/29/2014 1218   CREATININE 0.66 01/17/2024 1028   CALCIUM 9.1 01/17/2024 1028   CALCIUM 9.2 03/29/2014 1218   GFRNONAA >60 03/29/2014 1218   GFRAA >60 03/29/2014 1218    Lipid Panel     Component Value Date/Time   CHOL 160 01/17/2024 1028   TRIG 74 01/17/2024 1028   HDL 56 01/17/2024 1028   CHOLHDL 2.9 01/17/2024 1028   VLDL 12.0  08/21/2019 0836   LDLCALC 88 01/17/2024 1028    CBC    Component Value Date/Time   WBC 6.1 01/17/2024 1028   RBC 4.88 01/17/2024 1028   HGB 13.8 01/17/2024 1028   HGB 14.1 03/29/2014 1218   HCT 42.8 01/17/2024 1028   HCT 43.2 03/29/2014 1218   PLT 224 01/17/2024 1028   PLT 216 03/29/2014 1218   MCV 87.7 01/17/2024 1028   MCV 85 03/29/2014 1218   MCH 28.3 01/17/2024 1028   MCHC 32.2 01/17/2024 1028   RDW 12.4 01/17/2024 1028   RDW 13.3 03/29/2014 1218   LYMPHSABS 1.8 08/21/2019 0836   MONOABS 0.3 08/21/2019 0836   EOSABS 0.0 08/21/2019 0836   BASOSABS 0.0 08/21/2019 0836    Hgb A1C Lab Results  Component Value Date   HGBA1C 5.0 01/17/2024            Assessment & Plan:    RTC in 6 months for your annual exam Angeline Laura, NP  "

## 2024-12-19 ENCOUNTER — Ambulatory Visit: Payer: Self-pay | Admitting: Internal Medicine

## 2024-12-19 LAB — T4, FREE: Free T4: 1 ng/dL (ref 0.8–1.8)

## 2024-12-19 LAB — TSH: TSH: 3.61 m[IU]/L

## 2025-06-28 ENCOUNTER — Encounter: Admitting: Internal Medicine
